# Patient Record
Sex: Male | Born: 1960 | Race: White | Hispanic: No | Marital: Married | Smoking: Current every day smoker
Health system: Southern US, Community
[De-identification: ages and names within clinical notes are randomized; demographics above are authoritative.]

## PROBLEM LIST (undated history)

## (undated) DIAGNOSIS — I2510 Atherosclerotic heart disease of native coronary artery without angina pectoris: Secondary | ICD-10-CM

## (undated) DIAGNOSIS — K219 Gastro-esophageal reflux disease without esophagitis: Secondary | ICD-10-CM

## (undated) DIAGNOSIS — I714 Abdominal aortic aneurysm, without rupture, unspecified: Secondary | ICD-10-CM

## (undated) DIAGNOSIS — R9430 Abnormal result of cardiovascular function study, unspecified: Secondary | ICD-10-CM

## (undated) DIAGNOSIS — Q631 Lobulated, fused and horseshoe kidney: Secondary | ICD-10-CM

## (undated) DIAGNOSIS — I10 Essential (primary) hypertension: Secondary | ICD-10-CM

## (undated) DIAGNOSIS — Z720 Tobacco use: Secondary | ICD-10-CM

## (undated) DIAGNOSIS — IMO0002 Reserved for concepts with insufficient information to code with codable children: Secondary | ICD-10-CM

## (undated) HISTORY — DX: Atherosclerotic heart disease of native coronary artery without angina pectoris: I25.10

## (undated) HISTORY — DX: Lobulated, fused and horseshoe kidney: Q63.1

## (undated) HISTORY — DX: Reserved for concepts with insufficient information to code with codable children: IMO0002

## (undated) HISTORY — DX: Abnormal result of cardiovascular function study, unspecified: R94.30

## (undated) HISTORY — DX: Abdominal aortic aneurysm, without rupture: I71.4

## (undated) HISTORY — DX: Gastro-esophageal reflux disease without esophagitis: K21.9

## (undated) HISTORY — DX: Tobacco use: Z72.0

## (undated) HISTORY — DX: Abdominal aortic aneurysm, without rupture, unspecified: I71.40

---

## 1998-09-18 HISTORY — PX: CORONARY STENT PLACEMENT: SHX1402

## 1999-12-09 ENCOUNTER — Encounter: Payer: Self-pay | Admitting: Emergency Medicine

## 1999-12-09 ENCOUNTER — Inpatient Hospital Stay (HOSPITAL_COMMUNITY): Admission: EM | Admit: 1999-12-09 | Discharge: 1999-12-13 | Payer: Self-pay | Admitting: Emergency Medicine

## 2000-06-26 ENCOUNTER — Emergency Department (HOSPITAL_COMMUNITY): Admission: EM | Admit: 2000-06-26 | Discharge: 2000-06-26 | Payer: Self-pay | Admitting: Emergency Medicine

## 2002-05-07 ENCOUNTER — Emergency Department (HOSPITAL_COMMUNITY): Admission: EM | Admit: 2002-05-07 | Discharge: 2002-05-07 | Payer: Self-pay | Admitting: Emergency Medicine

## 2006-01-23 ENCOUNTER — Emergency Department (HOSPITAL_COMMUNITY): Admission: EM | Admit: 2006-01-23 | Discharge: 2006-01-23 | Payer: Self-pay | Admitting: Emergency Medicine

## 2009-05-21 ENCOUNTER — Emergency Department (HOSPITAL_COMMUNITY): Admission: EM | Admit: 2009-05-21 | Discharge: 2009-05-21 | Payer: Self-pay | Admitting: Emergency Medicine

## 2009-09-20 ENCOUNTER — Emergency Department (HOSPITAL_COMMUNITY): Admission: EM | Admit: 2009-09-20 | Discharge: 2009-09-20 | Payer: Self-pay | Admitting: Emergency Medicine

## 2010-12-04 LAB — URINALYSIS, ROUTINE W REFLEX MICROSCOPIC
Bilirubin Urine: NEGATIVE
Glucose, UA: NEGATIVE mg/dL
Ketones, ur: NEGATIVE mg/dL
Leukocytes, UA: NEGATIVE
Nitrite: NEGATIVE
Protein, ur: NEGATIVE mg/dL
Specific Gravity, Urine: 1.018 (ref 1.005–1.030)
Urobilinogen, UA: 1 mg/dL (ref 0.0–1.0)
pH: 6 (ref 5.0–8.0)

## 2010-12-04 LAB — DIFFERENTIAL
Basophils Absolute: 0.3 10*3/uL — ABNORMAL HIGH (ref 0.0–0.1)
Basophils Relative: 2 % — ABNORMAL HIGH (ref 0–1)
Eosinophils Absolute: 0.5 10*3/uL (ref 0.0–0.7)
Eosinophils Relative: 4 % (ref 0–5)
Lymphocytes Relative: 22 % (ref 12–46)
Monocytes Absolute: 1 10*3/uL (ref 0.1–1.0)

## 2010-12-04 LAB — CBC
HCT: 49.9 % (ref 39.0–52.0)
Hemoglobin: 17.7 g/dL — ABNORMAL HIGH (ref 13.0–17.0)
MCHC: 35.5 g/dL (ref 30.0–36.0)
MCV: 86.2 fL (ref 78.0–100.0)
Platelets: 424 10*3/uL — ABNORMAL HIGH (ref 150–400)
RBC: 5.78 MIL/uL (ref 4.22–5.81)
RDW: 13.5 % (ref 11.5–15.5)
WBC: 12.3 10*3/uL — ABNORMAL HIGH (ref 4.0–10.5)

## 2010-12-04 LAB — COMPREHENSIVE METABOLIC PANEL
AST: 15 U/L (ref 0–37)
Albumin: 3.4 g/dL — ABNORMAL LOW (ref 3.5–5.2)
BUN: 9 mg/dL (ref 6–23)
Calcium: 8.6 mg/dL (ref 8.4–10.5)
Creatinine, Ser: 1.16 mg/dL (ref 0.4–1.5)
GFR calc Af Amer: >60 mL/min (ref >60)
Sodium: 138 mEq/L (ref 135–145)
Total Protein: 6.8 g/dL (ref 6.0–8.3)

## 2010-12-04 LAB — URINE MICROSCOPIC-ADD ON

## 2010-12-23 LAB — BASIC METABOLIC PANEL
CO2: 25 mEq/L (ref 19–32)
Calcium: 8.5 mg/dL (ref 8.4–10.5)
GFR calc Af Amer: >60 mL/min (ref >60)
Glucose, Bld: 122 mg/dL — ABNORMAL HIGH (ref 70–99)
Potassium: 3.3 mEq/L — ABNORMAL LOW (ref 3.5–5.1)
Sodium: 141 mEq/L (ref 135–145)

## 2010-12-23 LAB — CBC
HCT: 42.8 % (ref 39.0–52.0)
Hemoglobin: 14.8 g/dL (ref 13.0–17.0)
MCHC: 34.6 g/dL (ref 30.0–36.0)
RBC: 4.83 MIL/uL (ref 4.22–5.81)
RDW: 13.6 % (ref 11.5–15.5)

## 2010-12-23 LAB — DIFFERENTIAL
Basophils Relative: 0 % (ref 0–1)
Eosinophils Absolute: 0.6 10*3/uL (ref 0.0–0.7)
Lymphs Abs: 2.5 10*3/uL (ref 0.7–4.0)
Monocytes Absolute: 0.8 10*3/uL (ref 0.1–1.0)
Monocytes Relative: 7 % (ref 3–12)
Neutro Abs: 7.8 10*3/uL — ABNORMAL HIGH (ref 1.7–7.7)
Neutrophils Relative %: 66 % (ref 43–77)

## 2011-02-03 NOTE — Cardiovascular Report (Signed)
[place]. [facility]  Patient:    [name], Andrew Huber                      MRN: [id number] Proc. Date: 12/12/99 Adm. Date:  [id number] Disc. Date: [id number] Attending:  Mirian Mo CC:         Everardo Beals. Juanda Chance, [name] [facility]             Dr. Ladona Ridgel; [place], [place]             Daisey Must, [name] [facility]             [name], [name] [facility]                        Cardiac Catheterization  INDICATIONS:  Mr. Trippe is 50 years old; recently admitted with unstable angina, after being seen in [facility] Emergency Room.  The diagnostic catheterization was performed by Dr. Antoine Poche, which showed a high-grade lesion in the proximal LAD, as well as a high-grade lesion in the circumflex marginal vessel. The circumflex was dominant.  There was a posterior descending lesion, but this was fairly far distal.  The right coronary artery was a small, nondominant vessel that had moderately diffuse disease.  We elected to perform intervention on the LAD nd the obtuse marginal.  The ECG showed anterior T-wave changes, and we felt the LAD was the culprit.  PROCEDURAL NOTE:  The procedure was performed by the right femoral artery with  7-French sheath and 7-French 4.0 Voda guiding catheter.  We used a long floppy ire for the LAD and crossed the lesion without difficulty.  We direct-stented with  13 mm 3.5 Tetra stent, and deployed this with two inflations of 12 and 14 atm for 49 and 38 sec.  We then post-dilated with a 3.75 x 9 mm MC Ranger, with one inflation of 15 atm for 44 sec.  Repeat diagnostic study was then performed through the guiding catheter.  We next turned our attention to the circumflex marginal vessel.  We used a Sport wire and crossed the lesion without difficulty.  We went in with a 3.0 x 15 mm cutting balloon, and positioned this across the ostium of the marginal vessel.  There was an ulcerated plaque in the native circumflex artery,  right at the superior edge of the ostium.  We performed a total of three inflations, up to 7 atm for 52 sec.  Repeat diagnostic study was then performed through the guiding catheter.  The patient tolerated the procedure well and left the laboratory in satisfactory condition.  RESULTS:  Initially the stenosis in the proximal LAD was 90%.  Following stenting this improved to 0%.  The lesion in the circumflex marginal was initially 90%.  Following balloon cutting this improved to 40%.  The ulcerated plaque that was located just superior to the ostium improved also after the cutting balloon.  CONCLUSIONS: 1. Successful stenting of the proximal LAD, with improvement of percent luminal    narrowing from 90% to 0%. 2. Successful balloon angioplasty with a cutting balloon of the ostium of the    circumflex marginal vessel.  There was improvement of percent luminal narrowing    from 90% to 40%.  DISPOSITION:  The patient was returned to the post-anesthesia unit for further observation. DD:  12/12/99 TD:  12/13/99 Job: [id number]

## 2011-02-03 NOTE — Discharge Summary (Signed)
[place]. [facility]  Patient:    Andrew Huber, Andrew Huber                      MRN: [id number] Adm. Date:  [id number] Disc. Date: [id number] Attending:  Mirian Mo Dictator:   Tereso Newcomer, P.A. CC:         Belva Crome, [name] in [facility], [name] [facility], [facility]                           Discharge Summary  DATE OF BIRTH:  1961-03-13  DISCHARGE DIAGNOSES: 1. Coronary artery disease, status post stent to LAD, status post PTCA to OM    December 12, 1999. 2. Hyperlipidemia. 3. Hypertension. 4. Tobacco abuse. 5. History of heavy alcohol use. Quit 10 years ago. 6. History of cocaine and marijuana use. Quit 10 years ago.  HISTORY OF PRESENT ILLNESS:  This 50 year old male with no prior cardiac history started having bilateral chest pain and epigastric pain that would awake him at 4:30 or 5 oclock in the morning about two weeks prior to admission. The pain started to get more frequent, especially during the day. He eventually went to [facility] to have a workup. His enzymes were negative per his report. The patient had a persantine Cardiolite performed by Dr. Tomie China on December 07, 1999. Cardial images were consistent with a small area of anterior wall ischemia. EF 55%. There was approximately 1 mm ST segment depression in lead V3 compared to baseline EKG during the study. Baseline EKG had nonspecific ST-T changes. No significant symptoms or arrhythmias developed. The patient was, therefore, set up for an outpatient cardiac catheterization with Daisey Must, [name] at [facility] on Tuesday, December 13, 1999. However, he became much worse on the date of admission. He took two nitroglycerin that morning and two nitroglycerin that evening. The nitroglycerin did provide some relief. He had positive radiation to his neck and arms bilaterally. Positive diaphoresis. Positive nausea and shortness of breath. No  exertional symptoms, except for dyspnea on exertion with minimal activity. He stated that he would get short of breath going to the bathroom. Upon initial examination in the emergency room, he was pain free.  His cardiac risk factors include positive tobacco history, two packs per day for 25 years. Positive history of cocaine and marijuana use, discontinued 10 years ago. Positive history of heavy alcohol use, quit 10 years ago. Positive family history of CAD. Positive hypertension, recently started on medications.   ADMISSION MEDICATIONS: 1. Lopressor 25 mg b.i.d. 2. Captopril 50 mg t.i.d. 3. Aspirin q.d. 4. Nitroglycerin p.r.n.  ADMISSION PHYSICAL EXAMINATION:  GENERAL:  Revealed a well-nourished, well-developed male in no acute distress.  VITAL SIGNS:  Blood pressure 188/118, pulse in the 70s, respirations at 15.  HEART:  S1/S2. No obvious murmurs. No S4.  LUNGS:  Decreased breath sounds. Clear to auscultation bilaterally.  VASCULAR:  Reveals 2+ femoral artery pulses. No bruits noted.  NEUROLOGICAL:  Nonfocal.  LABORATORY DATA:  EKG:  Heart rate 75. Normal sinus rhythm. About 1 mm ST segment depression in 1, aVL, V4 through V6. T wave inversions in V4 through V6.  Chest x-ray:  No acute disease noted.  TSH 1.262. Total cholesterol 194, triglycerides 130, HDL 29, LDL 139. Sodium 140, potassium 3.5, chloride 109, CO2 26, glucose 94, BUN 15, creatinine  1, calcium 9.3, total protein 7, albumin 3.6, AST 21, ALT 24, alkaline phosphatase 62, total bilirubin 0.2. WBC 10.8, hemoglobin 15.2, hematocrit 42.7, MCV 83.5, RDW 12.9, platelet count 368. PT 13.5, INR 1.1, PTT 30. Cardiac enzymes:  Total CK #1 66, CK-MB 0.5, troponin I of less than 0.03. Total CK #2 56, CK-MB 0.7, troponin I of less than 0.03. Total CK #3 43, CK-MB of less than 0.3, troponin I of less than 0.03. Total CK #4 49, CK-MB 0.3, troponin I of less than 0.03. Last troponin 0.07.  HOSPITAL COURSE:  The patient  was admitted to telemetry and placed on IV nitroglycerin and heparin, aspirin, beta blocker, and ACE inhibitor. He was setup for cardiac catheterization on Monday after admission. He did have some other episodes of chest tightness prior to his catheterization that were relieved with nitroglycerin. The patient had worsening EKG changes with these symptoms as well. He was placed on Aggrastat in addition to his other medications. He remained stable after being placed on the Aggrastat. He had no recurrent symptoms. On December 12, 1999, he went for cardiac catheterization. Left main was normal. LAD osteal 25% proximal, 90%-60% mid long 30% diffuse disease plaquing throughout, circumflex diffuse AV groove plaquing, MOM osteal 80%, PL mid 30% small vessel RCA nondominant mid 80%. EF 60%. The patient had PCI performed that same day. Stent was placed to the LAD, reducing stenosis from 90% to 0%. PTCA was performed to the OM reducing stenosis from 90 to 40%. He tolerated the procedure well. He was noted to be stable on December 13, 1999. His groin showed no signs of hematoma or bruit. Therefore, he was discharged to home.  DISCHARGE MEDICATIONS: 1. Plavix 75 mg q.d. for 4 weeks. 2. Lopressor 50 mg b.i.d. 3. Captopril 50 mg t.i.d. 4. Lipitor 20 mg q.h.s. 5. Enteric-coated aspirin 325 mg q.d. 6. Nitroglycerin 0.4 mg sublingual p.r.n. chest pain.  Please note the patients discharge EKG did show anterolateral T wave inversions.  ACTIVITIES:  The patient is to do no driving, sexual activity, heavy lifting, or exertional activity for three days.  DIET:  He is to maintain a low-fat, low-cholesterol, low-sodium diet.  INSTRUCTIONS:  He should watch his groin for increased swelling, bleeding, or redness and call our office with any concerns. The patient has been advised to quit smoking.  FOLLOW-UP:  He has been setup for a follow-up appointment with Dr. Tomie China on Monday, December 19, 1999, at 10:30  a.m. DD:  12/13/99 TD:  12/13/99 Job: [id number]

## 2011-02-03 NOTE — Cardiovascular Report (Signed)
[place]. [facility]  Patient:    Andrew Huber, Andrew Huber                      MRN: [id number] Proc. Date: 12/12/99 Adm. Date:  [id number] Disc. Date: [id number] Attending:  Mirian Mo CC:         Cardiac Catheterization Laboratory             [name], [name] [facility]                        Cardiac Catheterization  DATE OF BIRTH:  [birth year]  PROCEDURE:  Left heart catheterization/coronary arteriography.  CARDIOLOGIST:  [name], [name]  INDICATIONS:  Evaluate patient with unstable angina and electrocardiogram changes.  DESCRIPTION OF PROCEDURE:  A left heart catheterization was performed via the right femoral artery.  The vessels were cannulated using an anterior wall puncture.  6-French arterial sheath was inserted via the modified Seldinger technique.  A preformed Judkins and a pigtail catheter were utilized.  The patient tolerated he procedure well and left the laboratory in stable condition.  HEMODYNAMICS: LV:  170/24. AO:  172/101.  CORONARIES: 1. Left main coronary artery:  The left main coronary artery was normal. 2. Left anterior descending coronary artery:  Had an ostial 25% stenosis,    followed by a proximal 90% stenosis, and mid-60% stenosis.  There was a long    mid-30% plaquing.  There was diffuse plaquing throughout the left system. 3. Circumflex coronary artery:  The circumflex was a dominant vessel.  There    was again diffuse plaquing in the AV groove.  A mid-obtuse marginal was a    large vessel with an ostial 80% stenosis.  A distal posterolateral was a    small vessel with mid-80% stenosis. 4. Right coronary artery:  the right coronary artery was nondominant with    mid-80% stenosis.  LEFT VENTRICULOGRAM:  The left ventriculogram was obtained in the RAO projection. The ejection fraction was 60% with normal wall motion.  CONCLUSION:  Three-vessel coronary artery disease equivalent.  PLAN:  The patient will  have a percutaneous revascularization of the left anterior descending coronary artery and mid-obtuse marginal artery per Dr. Everardo Beals. [name]. He will also have aggressive secondary risk factor modification with the need to stop smoking.  He will continue on lipid-lowering agents.DD:  12/12/99 TD:  12/13/99 Job: [id number]

## 2011-03-31 ENCOUNTER — Emergency Department (HOSPITAL_COMMUNITY)
Admission: EM | Admit: 2011-03-31 | Discharge: 2011-04-01 | Disposition: A | Discharge status: Home / Self Care | Payer: Medicaid Other | Attending: Emergency Medicine | Admitting: Emergency Medicine

## 2011-03-31 DIAGNOSIS — I10 Essential (primary) hypertension: Secondary | ICD-10-CM | POA: Insufficient documentation

## 2011-03-31 DIAGNOSIS — R12 Heartburn: Secondary | ICD-10-CM | POA: Insufficient documentation

## 2011-03-31 DIAGNOSIS — Z79899 Other long term (current) drug therapy: Secondary | ICD-10-CM | POA: Insufficient documentation

## 2011-03-31 DIAGNOSIS — R109 Unspecified abdominal pain: Secondary | ICD-10-CM | POA: Insufficient documentation

## 2011-03-31 DIAGNOSIS — K921 Melena: Secondary | ICD-10-CM | POA: Insufficient documentation

## 2011-03-31 DIAGNOSIS — I714 Abdominal aortic aneurysm, without rupture, unspecified: Secondary | ICD-10-CM | POA: Insufficient documentation

## 2011-03-31 DIAGNOSIS — I2510 Atherosclerotic heart disease of native coronary artery without angina pectoris: Secondary | ICD-10-CM | POA: Insufficient documentation

## 2011-03-31 DIAGNOSIS — R110 Nausea: Secondary | ICD-10-CM | POA: Insufficient documentation

## 2011-03-31 DIAGNOSIS — K922 Gastrointestinal hemorrhage, unspecified: Secondary | ICD-10-CM | POA: Insufficient documentation

## 2011-03-31 HISTORY — DX: Essential (primary) hypertension: I10

## 2011-04-01 ENCOUNTER — Encounter (HOSPITAL_COMMUNITY): Payer: Self-pay | Admitting: Radiology

## 2011-04-01 ENCOUNTER — Emergency Department (HOSPITAL_COMMUNITY): Payer: Medicaid Other

## 2011-04-01 LAB — DIFFERENTIAL
Eosinophils Relative: 6 % — ABNORMAL HIGH (ref 0–5)
Lymphocytes Relative: 32 % (ref 12–46)
Monocytes Absolute: 0.8 10*3/uL (ref 0.1–1.0)
Monocytes Relative: 7 % (ref 3–12)
Neutro Abs: 5.7 10*3/uL (ref 1.7–7.7)

## 2011-04-01 LAB — CBC
HCT: 39.2 % (ref 39.0–52.0)
Hemoglobin: 13.7 g/dL (ref 13.0–17.0)
MCH: 29.6 pg (ref 26.0–34.0)
MCHC: 34.9 g/dL (ref 30.0–36.0)
RDW: 13.4 % (ref 11.5–15.5)

## 2011-04-01 LAB — BASIC METABOLIC PANEL
BUN: 46 mg/dL — ABNORMAL HIGH (ref 6–23)
CO2: 21 mEq/L (ref 19–32)
Calcium: 8.7 mg/dL (ref 8.4–10.5)
Creatinine, Ser: 1.21 mg/dL (ref 0.50–1.35)
Glucose, Bld: 105 mg/dL — ABNORMAL HIGH (ref 70–99)

## 2011-04-01 LAB — URINALYSIS, ROUTINE W REFLEX MICROSCOPIC
Glucose, UA: NEGATIVE mg/dL
Hgb urine dipstick: NEGATIVE
Ketones, ur: NEGATIVE mg/dL
Protein, ur: NEGATIVE mg/dL
Urobilinogen, UA: 0.2 mg/dL (ref 0.0–1.0)

## 2011-04-01 MED ORDER — IOHEXOL 300 MG/ML  SOLN
100.0000 mL | Freq: Once | INTRAMUSCULAR | Status: AC | PRN
  Administered 2011-04-01: 100 mL via INTRAVENOUS

## 2011-07-17 ENCOUNTER — Encounter: Payer: Self-pay | Admitting: Cardiology

## 2011-07-19 ENCOUNTER — Encounter: Payer: Self-pay | Admitting: *Deleted

## 2011-07-20 ENCOUNTER — Institutional Professional Consult (permissible substitution): Payer: Self-pay | Admitting: Cardiology

## 2011-07-21 ENCOUNTER — Encounter: Payer: Self-pay | Admitting: Cardiology

## 2011-07-21 DIAGNOSIS — K219 Gastro-esophageal reflux disease without esophagitis: Secondary | ICD-10-CM | POA: Insufficient documentation

## 2011-07-21 DIAGNOSIS — I10 Essential (primary) hypertension: Secondary | ICD-10-CM | POA: Insufficient documentation

## 2011-07-24 ENCOUNTER — Encounter: Payer: Self-pay | Admitting: Cardiology

## 2011-07-24 ENCOUNTER — Ambulatory Visit (INDEPENDENT_AMBULATORY_CARE_PROVIDER_SITE_OTHER): Payer: Medicaid Other | Admitting: Cardiology

## 2011-07-24 DIAGNOSIS — K219 Gastro-esophageal reflux disease without esophagitis: Secondary | ICD-10-CM

## 2011-07-24 DIAGNOSIS — Z720 Tobacco use: Secondary | ICD-10-CM | POA: Insufficient documentation

## 2011-07-24 DIAGNOSIS — Q638 Other specified congenital malformations of kidney: Secondary | ICD-10-CM

## 2011-07-24 DIAGNOSIS — Q631 Lobulated, fused and horseshoe kidney: Secondary | ICD-10-CM

## 2011-07-24 DIAGNOSIS — I10 Essential (primary) hypertension: Secondary | ICD-10-CM

## 2011-07-24 DIAGNOSIS — F17200 Nicotine dependence, unspecified, uncomplicated: Secondary | ICD-10-CM

## 2011-07-24 DIAGNOSIS — I714 Abdominal aortic aneurysm, without rupture: Secondary | ICD-10-CM | POA: Insufficient documentation

## 2011-07-24 DIAGNOSIS — I2510 Atherosclerotic heart disease of native coronary artery without angina pectoris: Secondary | ICD-10-CM

## 2011-07-24 LAB — BASIC METABOLIC PANEL
BUN: 19 mg/dL (ref 6–23)
CO2: 26 mEq/L (ref 19–32)
Calcium: 8.6 mg/dL (ref 8.4–10.5)
Glucose, Bld: 86 mg/dL (ref 70–99)
Sodium: 141 mEq/L (ref 135–145)

## 2011-07-24 MED ORDER — AMLODIPINE BESYLATE 5 MG PO TABS: 5.0000 mg | ORAL_TABLET | Freq: Every day | ORAL | Status: DC

## 2011-07-24 NOTE — Assessment & Plan Note (Addendum)
The patient has known coronary disease with a stent placed in 2001.  He is having some exertional symptoms.  We need to assess him further.  His EKG is markedly abnormal.  Will arrange for a Myoview scan to be done pharmacologically. Also his EKG is markedly abnormal.  We need to reassess his LV function.  2-D echo will be done.

## 2011-07-24 NOTE — Assessment & Plan Note (Signed)
The patient's blood pressure is significantly elevated today.  He does appear to be somewhat improved since his outpatient visit with his primary physician.  I will add additional medication including amlodipine and see him back for early followup.

## 2011-07-24 NOTE — Patient Instructions (Signed)
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 For further information please visit [email or web address] Please follow instruction sheet, as given.   Your physician has requested that you have an echocardiogram. Echocardiography is a painless test that uses sound waves to create images of your heart. It provides your doctor with information about the size and shape of your heart and how well your heart's chambers and valves are working. This procedure takes approximately one hour. There are no restrictions for this procedure.

## 2011-07-24 NOTE — Assessment & Plan Note (Signed)
Patient continues to smoke. I counseled him to stop. 

## 2011-07-24 NOTE — Assessment & Plan Note (Signed)
We know that his aneurysm is very small by CT scan done July, 2012.  He will need a followup abdominal ultrasound in the future but not at this time.

## 2011-07-24 NOTE — Assessment & Plan Note (Signed)
Patient has significant GERD.  His medication should be continued.

## 2011-07-24 NOTE — Assessment & Plan Note (Signed)
This type of anatomy does not necessarily mean any renal dysfunction.  However with his current hypertension we need to check his chemistry.

## 2011-07-24 NOTE — Progress Notes (Signed)
HPI Patient is here today for evaluation of chest discomfort.  He is also here to follow up a prior diagnosis of coronary artery disease.  In addition he has a small abdominal aortic aneurysm that needs to be assessed and monitored over time.  He also has severe hypertension.  He is here to reestablish overall cardiology care.  We saw him last in 2001.  As part of today's evaluation I carefully reviewed the old records that I can find.  In addition I reviewed the information sent by his primary physician.  The patient has had some GI symptoms.  He is being treated for GERD.  Along with this so he's had some discomfort in his chest and his arms.  He has known coronary disease with a stent placed in 2001.  At that time he did have arm discomfort. No Known Allergies  Current Outpatient Prescriptions  Medication Sig Dispense Refill  . atorvastatin (LIPITOR) 20 MG tablet Take 20 mg by mouth daily.        Marland Kitchen lisinopril (PRINIVIL,ZESTRIL) 20 MG tablet Take 20 mg by mouth daily.        . metoprolol (TOPROL-XL) 100 MG 24 hr tablet Take 100 mg by mouth daily.        . nitroGLYCERIN (NITROSTAT) 0.4 MG SL tablet Place 0.4 mg under the tongue every 5 (five) minutes as needed.          History   Social History  . Marital Status: Married    Spouse Name: N/A    Number of Children: N/A  . Years of Education: N/A   Occupational History  . Not on file.   Social History Main Topics  . Smoking status: Current Everyday Smoker  . Smokeless tobacco: Not on file  . Alcohol Use: Yes     occasional  . Drug Use: No  . Sexually Active: Not on file   Other Topics Concern  . Not on file   Social History Narrative  . No narrative on file    Family History  Problem Relation Age of Onset  . Hypertension    . Hyperlipidemia    . Heart disease      Past Medical History  Diagnosis Date  . Hypertension   . CAD (coronary artery disease)     Stent to LAD,  PTCA OM  2001  . GERD (gastroesophageal reflux  disease)   . AAA (abdominal aortic aneurysm)     3 cm saccular aneurysm, CT of the abdomen, July, 2012  . Ejection fraction     EF 55%, cath 2001  . Tobacco abuse   . Horseshoe kidney     Abdominal CT, July, 2012    Past Surgical History  Procedure Date  . Coronary stent placement 2000    ROS  Patient denies fever, chills, headache, sweats, rash, change in vision, change in hearing, cough, urinary symptoms.  All other systems are reviewed and are negative other than the history of present illness.  PHYSICAL EXAM Patient is stable.  He is oriented to person time and place.  Affect is normal.  Head is atraumatic.  There is no xanthelasma.  There is no jugular venous distention.  Lungs are clear.  Respiratory effort is nonlabored.  Cardiac exam reveals muscle and S2.  No clicks or significant murmurs.  Abdomen is soft.  There is no peripheral edema.  No musculoskeletal deformities.  There are no skin rashes. Filed Vitals:   07/24/11 1050  BP: 156/110  Pulse: 90  Resp: 18  Height: 5\' 11"  (1.803 m)  Weight: 232 lb 12.8 oz (105.597 kg)    EKG is done today and reviewed by me.  I have compared to the tracing of July 17, 2011.  There is no significant change.  The patient has increased voltage and significant diffuse T-wave changes.  This may all be related to his hypertensive disease.  ASSESSMENT & PLAN

## 2011-08-07 ENCOUNTER — Ambulatory Visit (HOSPITAL_COMMUNITY): Payer: Medicaid Other | Attending: Cardiology | Admitting: Radiology

## 2011-08-07 ENCOUNTER — Ambulatory Visit (HOSPITAL_BASED_OUTPATIENT_CLINIC_OR_DEPARTMENT_OTHER): Payer: Medicaid Other | Admitting: Radiology

## 2011-08-07 VITALS — Ht 71.0 in | Wt 229.0 lb

## 2011-08-07 DIAGNOSIS — R0602 Shortness of breath: Secondary | ICD-10-CM | POA: Insufficient documentation

## 2011-08-07 DIAGNOSIS — I10 Essential (primary) hypertension: Secondary | ICD-10-CM | POA: Insufficient documentation

## 2011-08-07 DIAGNOSIS — Z8249 Family history of ischemic heart disease and other diseases of the circulatory system: Secondary | ICD-10-CM | POA: Insufficient documentation

## 2011-08-07 DIAGNOSIS — R072 Precordial pain: Secondary | ICD-10-CM

## 2011-08-07 DIAGNOSIS — R0989 Other specified symptoms and signs involving the circulatory and respiratory systems: Secondary | ICD-10-CM | POA: Insufficient documentation

## 2011-08-07 DIAGNOSIS — R079 Chest pain, unspecified: Secondary | ICD-10-CM | POA: Insufficient documentation

## 2011-08-07 DIAGNOSIS — Z9861 Coronary angioplasty status: Secondary | ICD-10-CM | POA: Insufficient documentation

## 2011-08-07 DIAGNOSIS — R5381 Other malaise: Secondary | ICD-10-CM | POA: Insufficient documentation

## 2011-08-07 DIAGNOSIS — R000 Tachycardia, unspecified: Secondary | ICD-10-CM | POA: Insufficient documentation

## 2011-08-07 DIAGNOSIS — I2510 Atherosclerotic heart disease of native coronary artery without angina pectoris: Secondary | ICD-10-CM

## 2011-08-07 DIAGNOSIS — F17200 Nicotine dependence, unspecified, uncomplicated: Secondary | ICD-10-CM | POA: Insufficient documentation

## 2011-08-07 DIAGNOSIS — R0609 Other forms of dyspnea: Secondary | ICD-10-CM | POA: Insufficient documentation

## 2011-08-07 DIAGNOSIS — R9431 Abnormal electrocardiogram [ECG] [EKG]: Secondary | ICD-10-CM | POA: Insufficient documentation

## 2011-08-07 MED ORDER — TECHNETIUM TC 99M TETROFOSMIN IV KIT
11.0000 | PACK | Freq: Once | INTRAVENOUS | Status: AC | PRN
  Administered 2011-08-07: 11 via INTRAVENOUS

## 2011-08-07 MED ORDER — TECHNETIUM TC 99M TETROFOSMIN IV KIT
33.0000 | PACK | Freq: Once | INTRAVENOUS | Status: AC | PRN
  Administered 2011-08-07: 33 via INTRAVENOUS

## 2011-08-07 MED ORDER — REGADENOSON 0.4 MG/5ML IV SOLN
0.4000 mg | Freq: Once | INTRAVENOUS | Status: AC
  Administered 2011-08-07: 0.4 mg via INTRAVENOUS

## 2011-08-07 NOTE — Progress Notes (Signed)
[facility] SITE 3 NUCLEAR MED [address] [place] [address] [phone]  Cardiology Nuclear Med Study  Andrew Huber is a 50 y.o. male [id number] [birth year]   Nuclear Med Background Indication for Stress Test:  Evaluation for Ischemia, Stent Patency, PTCA Patency and Abnormal [id number] T wave changes History: '01 Angioplasty, '01 Heart Catheterization: EF 55% to stent, '01 Myocardial Perfusion Study: EF 55% ant wall ischemia and '01 Stents: LAD Cardiac Risk Factors: Family History - CAD, Hypertension and Smoker  Symptoms:  Chest Pain, DOE, Fatigue, Rapid HR and SOB   Nuclear Pre-Procedure Caffeine/Decaff Intake:  10:00pm BC powder  NPO After: 12:00am   Lungs:  clear IV 0.9% NS with Angio Cath:  20g  IV Site: L Antecubital  IV Started by:  Frederick Peers, EMT-P  Chest Size (in):  44 Cup Size: n/a  Height: 5\' 11"  (1.803 m)  Weight:  229 lb (103.874 kg)  BMI:  Body mass index is 31.94 kg/(m^2). Tech Comments:  Toprol taken at 0630 am this morning. Patient states BP remains high with RX, advised continue to monitor and follow up with primary care. Any extremes or symptomatic to to the ED.    Nuclear Med Study 1 or 2 day study: 1 day  Stress Test Type:  Treadmill/Lexiscan  Reading MD: Willa Rough, MD  Order Authorizing Provider:  Effie Shy  Resting Radionuclide: Technetium 47m Tetrofosmin  Resting Radionuclide Dose: 11.0 mCi   Stress Radionuclide:  Technetium 55m Tetrofosmin  Stress Radionuclide Dose: 33.0 mCi           Stress Protocol Rest HR: 57 Stress HR: 93  Rest BP: 158/93 Stress BP: 169/110  Exercise Time (min): n/a METS: n/a   Predicted Max HR: 170 bpm % Max HR: 54.71 bpm Rate Pressure Product: 69629   Dose of Adenosine (mg):  n/a Dose of Lexiscan: 0.4 mg  Dose of Atropine (mg): n/a Dose of Dobutamine: n/a mcg/kg/min (at max HR)  Stress Test Technologist: Milana Na, EMT-P  Nuclear Technologist:  Domenic Polite, CNMT      Rest Procedure:  Myocardial perfusion imaging was performed at rest 45 minutes following the intravenous administration of Technetium 65m Tetrofosmin. Rest ECG: Sinus Bradycardia  Stress Procedure:  The patient received IV Lexiscan 0.4 mg over 15-seconds with concurrent low level exercise and then Technetium 15m Tetrofosmin was injected at 30-seconds while the patient continued walking one more minute.  There were no significant changes with Lexiscan.  Quantitative spect images were obtained after a 45-minute delay. Stress ECG: No significant change from baseline ECG  QPS Raw Data Images:  Patient motion noted; appropriate software correction applied. Stress Images:  Decreased activity in a small area at the base of the inferior wall. Also, decreased activity at the base of the inferolateral wall, Rest Images:  The defect in the inferior wall remains fixed. The defect in the inferolateral wall has some reversibilty, Subtraction (SDS):  Mild ischemia in the inferolateral wall. Transient Ischemic Dilatation (Normal <1.22):  1.05 Lung/Heart Ratio (Normal <0.45):  0.33  Quantitative Gated Spect Images QGS EDV:  198 ml QGS ESV:  136 ml QGS cine images:  There is diffuse dyssynergy. I am not convinced that the motion data is reliable. Echo comparison will be needed. QGS EF: 31%  Impression Exercise Capacity:  Lexiscan with no exercise. BP Response:  Normal blood pressure response. Clinical Symptoms:  SOB ECG Impression:  No significant ST segment change suggestive of ischemia. Comparison with Prior Nuclear Study:  No images to compare  Overall Impression:  There is a small scar at the base of the inferior wall. There is small scar/ischemia at the base of the inferolateral wall. The wall motion data may not be reliable.  Willa Rough, MD

## 2011-08-14 ENCOUNTER — Encounter: Payer: Self-pay | Admitting: Cardiology

## 2011-08-14 ENCOUNTER — Telehealth: Payer: Self-pay | Admitting: Cardiology

## 2011-08-14 DIAGNOSIS — R9430 Abnormal result of cardiovascular function study, unspecified: Secondary | ICD-10-CM | POA: Insufficient documentation

## 2011-08-14 DIAGNOSIS — I2510 Atherosclerotic heart disease of native coronary artery without angina pectoris: Secondary | ICD-10-CM | POA: Insufficient documentation

## 2011-08-14 DIAGNOSIS — IMO0002 Reserved for concepts with insufficient information to code with codable children: Secondary | ICD-10-CM | POA: Insufficient documentation

## 2011-08-14 NOTE — Telephone Encounter (Signed)
Fu call Pt was returning your call about test results

## 2011-08-15 ENCOUNTER — Encounter: Payer: Self-pay | Admitting: Cardiology

## 2011-08-15 ENCOUNTER — Ambulatory Visit (INDEPENDENT_AMBULATORY_CARE_PROVIDER_SITE_OTHER): Payer: Medicaid Other | Admitting: Cardiology

## 2011-08-15 DIAGNOSIS — I2510 Atherosclerotic heart disease of native coronary artery without angina pectoris: Secondary | ICD-10-CM

## 2011-08-15 DIAGNOSIS — I10 Essential (primary) hypertension: Secondary | ICD-10-CM

## 2011-08-15 MED ORDER — AMLODIPINE BESYLATE 10 MG PO TABS: 10.0000 mg | ORAL_TABLET | Freq: Every day | ORAL | Status: DC

## 2011-08-15 MED ORDER — HYDROCHLOROTHIAZIDE 25 MG PO TABS: 25.0000 mg | ORAL_TABLET | Freq: Every day | ORAL | Status: DC

## 2011-08-15 NOTE — Telephone Encounter (Signed)
N/A.  LMTC. 

## 2011-08-15 NOTE — Patient Instructions (Signed)
Your physician recommends that you schedule a follow-up appointment in: 3 MONTHS WITH DR Myrtis Ser AND  NEEDS TO SEE PMD  IN 2-3 WEEKS   Your physician has recommended you make the following change in your medication: INCREASE AMLODIPINE TO 10 MG EVERY DAY AND START HCTZ 25 MG EVERY DAY

## 2011-08-15 NOTE — Assessment & Plan Note (Signed)
Blood pressure remains elevated.  His amlodipine will be increased to 10 mg daily and hydrochlorothiazide will be added.  I've asked him to follow up with his primary physician for blood pressure followup and further adjustment.  I will see him back in 3 months.

## 2011-08-15 NOTE — Assessment & Plan Note (Signed)
The patient's coronary status is stable.  He has some old scar at the base of the inferior wall.  There may be some mild peri-infarct ischemia.  There is no high-grade ischemia.  He does not need catheterization at this time.

## 2011-08-15 NOTE — Progress Notes (Signed)
HPI   Patient is seen today for follow up chest pain and hypertension.  I saw him last July 24, 2011.  Renal he has coronary disease.  He had a stent placed to the LAD in the past and a PTCA of OM.  This was done in 2001.  A nuclear stress study was done along with an echo.  The nuclear study raised the question of small scar and a small amount of ischemia at the base of the inferior and inferolateral walls.  The wall motion assessment of the nuclear scan was not reliable.  The two-dimensional echo revealed decreased motion at the base of the inferolateral wall.  Patient returned today and I have reviewed these issues with him.  Also his blood pressure was quite high and I started amlodipine at the time of his last visit.  His blood pressure remains elevated but it is somewhat improved. No Known Allergies  Current Outpatient Prescriptions  Medication Sig Dispense Refill  . amLODipine (NORVASC) 5 MG tablet Take 1 tablet (5 mg total) by mouth daily.  30 tablet  11  . atorvastatin (LIPITOR) 20 MG tablet Take 20 mg by mouth daily.        Marland Kitchen lisinopril (PRINIVIL,ZESTRIL) 20 MG tablet Take 20 mg by mouth daily.        . metoprolol (TOPROL-XL) 100 MG 24 hr tablet Take 100 mg by mouth daily.        . nitroGLYCERIN (NITROSTAT) 0.4 MG SL tablet Place 0.4 mg under the tongue every 5 (five) minutes as needed.        Marland Kitchen omeprazole (PRILOSEC) 20 MG capsule Take 20 mg by mouth daily.          History   Social History  . Marital Status: Married    Spouse Name: N/A    Number of Children: N/A  . Years of Education: N/A   Occupational History  . Not on file.   Social History Main Topics  . Smoking status: Current Everyday Smoker  . Smokeless tobacco: Not on file  . Alcohol Use: Yes     occasional  . Drug Use: No  . Sexually Active: Not on file   Other Topics Concern  . Not on file   Social History Narrative  . No narrative on file    Family History  Problem Relation Age of Onset  .  Hypertension    . Hyperlipidemia    . Heart disease      Past Medical History  Diagnosis Date  . Hypertension   . CAD (coronary artery disease)     Stent to LAD,  PTCA OM  2001 /   Nuclear, 08/07/2011, small scar at the base of the inferior wall., small scar/ischemia at the base of the inferolateral wall., wall motion data appears to be not reliable, Referred to. echo to  . GERD (gastroesophageal reflux disease)   . AAA (abdominal aortic aneurysm)     3 cm saccular aneurysm, CT of the abdomen, July, 2012  . Ejection fraction     EF 55%, cath 2001 /  EF 50-55%, echo, August 07, 2011, basal  inferior akinesis, Basal inferolateral hypokinesis.  . Tobacco abuse   . Horseshoe kidney     Abdominal CT, July, 2012    Past Surgical History  Procedure Date  . Coronary stent placement 2000    ROS    Patient denies fever, chills, headache, sweats, rash, change in vision, change in hearing, chest pain, cough, nausea  vomiting, urinary symptoms.  All other systems are reviewed and are negative.  PHYSICAL EXAM     Patient is stable today.  There is no jugular venous distention.  Lungs are clear.  Respiratory effort is nonlabored.  Cardiac exam reveals S1-S2.  No clicks or significant murmurs.  Abdomen is soft.  There is no peripheral edema.  Filed Vitals:   08/15/11 0955  BP: 165/103  Pulse: 66  Height: 5\' 11"  (1.803 m)  Weight: 234 lb 12.8 oz (106.505 kg)     ASSESSMENT & PLAN

## 2011-08-16 NOTE — Telephone Encounter (Signed)
Pt states he already received his test results at his appt yesterday

## 2011-10-30 ENCOUNTER — Ambulatory Visit: Payer: Medicaid Other | Admitting: Cardiology

## 2011-10-31 ENCOUNTER — Ambulatory Visit: Payer: Medicaid Other | Admitting: Physician Assistant

## 2012-01-29 ENCOUNTER — Ambulatory Visit: Payer: Medicaid Other | Admitting: Physician Assistant

## 2012-01-31 ENCOUNTER — Encounter: Payer: Self-pay | Admitting: Physician Assistant

## 2012-01-31 ENCOUNTER — Ambulatory Visit (INDEPENDENT_AMBULATORY_CARE_PROVIDER_SITE_OTHER): Payer: Medicaid Other | Admitting: Physician Assistant

## 2012-01-31 VITALS — BP 148/94 | HR 63 | Ht 71.0 in | Wt 222.0 lb

## 2012-01-31 DIAGNOSIS — I714 Abdominal aortic aneurysm, without rupture: Secondary | ICD-10-CM

## 2012-01-31 DIAGNOSIS — F17200 Nicotine dependence, unspecified, uncomplicated: Secondary | ICD-10-CM

## 2012-01-31 DIAGNOSIS — I10 Essential (primary) hypertension: Secondary | ICD-10-CM

## 2012-01-31 DIAGNOSIS — I2510 Atherosclerotic heart disease of native coronary artery without angina pectoris: Secondary | ICD-10-CM

## 2012-01-31 DIAGNOSIS — R0602 Shortness of breath: Secondary | ICD-10-CM

## 2012-01-31 DIAGNOSIS — Z720 Tobacco use: Secondary | ICD-10-CM

## 2012-01-31 MED ORDER — ALBUTEROL SULFATE HFA 108 (90 BASE) MCG/ACT IN AERS: 2.0000 | INHALATION_SPRAY | Freq: Four times a day (QID) | RESPIRATORY_TRACT | Status: DC | PRN

## 2012-01-31 MED ORDER — LISINOPRIL 40 MG PO TABS: 40.0000 mg | ORAL_TABLET | Freq: Every day | ORAL | Status: DC

## 2012-01-31 NOTE — Progress Notes (Signed)
[address] [place], [place]  [address] Phone: [phone] Fax:  [phone]  Date:  01/31/2012   Name:  Andrew Huber   DOB:  [birth year]   MRN:  [id number]  PCP:  Berlinda Last, DO  Primary Cardiologist:  Dr. Zackery Barefoot  Primary Electrophysiologist:  None    History of Present Illness: Andrew Huber is a 51 y.o. male who returns for follow up.    He has a history of CAD, s/p prior PCI of the LAD and OM in 2001, hypertension, 3 cm saccular AAA abdominal CT 03/2011, tobacco abuse and GERD.  He saw Dr. Myrtis Ser 07/2011 for chest discomfort.  Prior to that visit, he was last seen in 2001.  LHC 11/1999: oLAD 25%, pLAD 90%, mLAD 60% and a long 30%, oMOM 80%, mPL 80% (small), mRCA 80% (non-dominant), EF 60%.  PCI 3/01 with Dr. Juanda Chance: BMS to the pLAD and cutting balloon angioplasty to the oOM.  Medical therapy pursued for the PL which was small and the RCA which was nondominant.  Lexiscan Myoview 07/2011: Small scar at the base of the inferior wall, small scar/ischemia at the base of the inferolateral wall, wall motion not reliable, EF calculated at 31%.  Echocardiogram 07/2011: Basal inferior AK, basal inferolateral HK, mild LVH, EF 50-55%, mild LAE.  Last seen by Dr. Myrtis Ser 08/15/11.  He felt there was no high-grade ischemia on his stress test and no further evaluation was planned at that time.  Patient's blood pressure was elevated and his amlodipine was adjusted with plans for follow up in 3 months.  He has been unable to return due to losing his Medicaid.  Just got it back.  Of note, out of medications until this past weekend.  Was feeling poorly.  But, now since back on HTN meds, feels better.  Denies chest pain, syncope, orthopnea, PND, edema.  Notes dyspnea with more extreme activities.  Notes class 2b symptoms at times.  Of note, smoking since age 41.  Currently at 1 1/2 ppd.  Has a chronic cough and notes wheezing.  Of note, asked 3 times about disability.  I  explained to him that from a cardiac standpoint, he does not qualify.    Wt Readings from Last 3 Encounters:  01/31/12 222 lb (100.699 kg)  08/15/11 234 lb 12.8 oz (106.505 kg)  08/07/11 229 lb (103.874 kg)     Potassium  Date/Time Value Range Status  07/24/2011 12:07 PM 4.2  3.5-5.1 (mEq/L) Final     Creatinine, Ser  Date/Time Value Range Status  07/24/2011 12:07 PM 1.3  0.4-1.5 (mg/dL) Final     ALT  Date/Time Value Range Status  09/20/2009  9:43 PM 15  0-53 (U/L) Final     Hemoglobin  Date/Time Value Range Status  04/01/2011  2:03 AM 13.7  13.0-17.0 (g/dL) Final    Past Medical History  Diagnosis Date  . Hypertension   . CAD (coronary artery disease)     Stent to LAD,  PTCA OM  2001 /   Nuclear, 08/07/2011, small scar at the base of the inferior wall., small scar/ischemia at the base of the inferolateral wall., wall motion data appears to be not reliable, Referred to. echo to  . GERD (gastroesophageal reflux disease)   . AAA (abdominal aortic aneurysm)     3 cm saccular aneurysm, CT of the abdomen, July, 2012  . Ejection fraction     EF 55%, cath 2001 /  EF 50-55%, echo,  August 07, 2011, basal  inferior akinesis, Basal inferolateral hypokinesis.  . Tobacco abuse   . Horseshoe kidney     Abdominal CT, July, 2012    Current Outpatient Prescriptions  Medication Sig Dispense Refill  . amLODipine (NORVASC) 10 MG tablet Take 1 tablet (10 mg total) by mouth daily.  30 tablet  11  . atorvastatin (LIPITOR) 20 MG tablet Take 20 mg by mouth daily.        . hydrochlorothiazide (HYDRODIURIL) 25 MG tablet Take 1 tablet (25 mg total) by mouth daily.  30 tablet  11  . lisinopril (PRINIVIL,ZESTRIL) 20 MG tablet Take 20 mg by mouth daily.        . metoprolol (TOPROL-XL) 100 MG 24 hr tablet Take 100 mg by mouth daily.        . nitroGLYCERIN (NITROSTAT) 0.4 MG SL tablet Place 0.4 mg under the tongue every 5 (five) minutes as needed.        Marland Kitchen omeprazole (PRILOSEC) 20 MG capsule Take 20  mg by mouth daily.          Allergies: No Known Allergies  History  Substance Use Topics  . Smoking status: Current Everyday Smoker  . Smokeless tobacco: Not on file  . Alcohol Use: Yes     occasional     ROS:  Please see the history of present illness.   Had URI symptoms recently.  Also noted abdominal pain and back pain until he got back on meds.  He was worried about his AAA.  Also, had n/v that has resolved.  He reports stomach pain when he is off Prilosec.  All other systems reviewed and negative.   PHYSICAL EXAM: VS:  BP 148/94  Pulse 63  Ht 5\' 11"  (1.803 m)  Wt 222 lb (100.699 kg)  BMI 30.96 kg/m2 Well nourished, well developed, in no acute distress HEENT: normal Neck: no JVD Vascular: no carotid bruits  Cardiac:  Distant  S1, S2; RRR; no murmur Lungs:  Decreased breath sounds bilaterally, no wheezing, rhonchi or rales Abd: soft, nontender, no hepatomegaly, no pulsatile masses Ext: no edema Skin: warm and dry Neuro:  CNs 2-12 intact, no focal abnormalities noted  EKG:  NSR, HR 69, normal axis, TW inversions in 1, 2, aVL, V4-6, no change from prior tracing   ASSESSMENT AND PLAN:  1.  Hypertension   -  Uncontrolled.   -  Just restarted medications.   -  Increase Lisinopril to 40 mg daily.   -  Check BMET with BP check in one week.  2.  Coronary Artery Disease   -  Stable.  No angina.  Continue ASA.   -  Recent myoview low risk.   -  Follow up with Dr. Zackery Barefoot in 3 mos.  3.  3 cm Abdominal Aortic Aneurysm   -  Arrange abdominal u/s.  4.  Dyspnea   -  Suspect he has COPD.   -  D/c cigs.   -  Arrange PFTs.   -  Rx for Proventil prn.   -  Refer to PCP.  If PFTs significantly abnormal, refer to pulmonary.   5.  Hyperlipidemia   -  Arrange FLP and LFTs.  6.  Tobacco Abuse   -  We discussed the importance of cessation and different strategies for quitting.   Counseled for > 10 mins.  7.  GERD   -  Continue PPI.  Refer to PCP.   Signed, Tereso Newcomer, PA-C  10:53 AM 01/31/2012

## 2012-01-31 NOTE — Patient Instructions (Addendum)
Your physician recommends that you schedule a follow-up appointment in: 3 MONTHS WITH DR. KATZ  PLEASE SCHEDULE NURSE VISIT FOR BLOOD PRESSURE CHECK WITH LAB WORK THE SAME DAY FOR A FASTING LIPID/LIVER PANEL AND BMET  Your physician has recommended that you have a pulmonary function test WITH DLCO; DX 786.05 SOB TO BE DONE @ [facility]. Pulmonary Function Tests are a group of tests that measure how well air moves in and out of your lungs.   PLEASE REFER PT TO [facility] DX HTN  Your physician has requested that you have an abdominal aorta duplex DX AAA. During this test, an ultrasound is used to evaluate the aorta. Allow 30 minutes for this exam. Do not eat after midnight the day before and avoid carbonated beverages  STOP SMOKING  INCREASE LISINOPRIL TO 40 MG DAILY  START PROVENTIL HFA INHALER TAKE 1-2 PUFFS EVERY 4-6 HOURS AS NEEDED

## 2012-02-07 ENCOUNTER — Other Ambulatory Visit: Payer: Medicaid Other

## 2012-02-07 ENCOUNTER — Ambulatory Visit: Payer: Medicaid Other

## 2012-02-08 ENCOUNTER — Other Ambulatory Visit: Payer: Medicaid Other

## 2012-02-08 ENCOUNTER — Ambulatory Visit: Payer: Medicaid Other

## 2012-03-01 ENCOUNTER — Ambulatory Visit: Payer: Self-pay

## 2012-03-01 ENCOUNTER — Other Ambulatory Visit: Payer: Self-pay

## 2012-05-10 ENCOUNTER — Ambulatory Visit: Payer: Medicaid Other | Admitting: Cardiology

## 2012-06-18 ENCOUNTER — Encounter: Payer: Self-pay | Admitting: Cardiology

## 2012-06-19 ENCOUNTER — Other Ambulatory Visit: Payer: Self-pay | Admitting: Cardiology

## 2012-07-07 ENCOUNTER — Other Ambulatory Visit: Payer: Self-pay | Admitting: Cardiology

## 2012-08-21 ENCOUNTER — Other Ambulatory Visit: Payer: Self-pay

## 2012-08-21 DIAGNOSIS — I10 Essential (primary) hypertension: Secondary | ICD-10-CM

## 2012-08-21 DIAGNOSIS — I2510 Atherosclerotic heart disease of native coronary artery without angina pectoris: Secondary | ICD-10-CM

## 2012-08-21 MED ORDER — HYDROCHLOROTHIAZIDE 25 MG PO TABS: 25.0000 mg | ORAL_TABLET | Freq: Every day | ORAL | Status: DC

## 2012-08-21 MED ORDER — AMLODIPINE BESYLATE 10 MG PO TABS: 10.0000 mg | ORAL_TABLET | Freq: Every day | ORAL | Status: DC

## 2012-10-30 ENCOUNTER — Other Ambulatory Visit: Payer: Self-pay | Admitting: *Deleted

## 2012-10-30 MED ORDER — LISINOPRIL 40 MG PO TABS: 40.0000 mg | ORAL_TABLET | Freq: Every day | ORAL | Status: DC

## 2012-10-30 MED ORDER — ATORVASTATIN CALCIUM 20 MG PO TABS: 20.0000 mg | ORAL_TABLET | Freq: Every day | ORAL | Status: DC

## 2012-11-27 ENCOUNTER — Other Ambulatory Visit: Payer: Self-pay

## 2012-11-27 MED ORDER — METOPROLOL SUCCINATE ER 100 MG PO TB24: 100.0000 mg | ORAL_TABLET | Freq: Every day | ORAL | Status: DC

## 2013-01-08 ENCOUNTER — Other Ambulatory Visit: Payer: Self-pay

## 2013-01-08 DIAGNOSIS — I10 Essential (primary) hypertension: Secondary | ICD-10-CM

## 2013-01-08 DIAGNOSIS — I2510 Atherosclerotic heart disease of native coronary artery without angina pectoris: Secondary | ICD-10-CM

## 2013-01-08 MED ORDER — AMLODIPINE BESYLATE 10 MG PO TABS: 10.0000 mg | ORAL_TABLET | Freq: Every day | ORAL | Status: DC

## 2013-02-27 ENCOUNTER — Other Ambulatory Visit: Payer: Self-pay

## 2013-02-27 DIAGNOSIS — I10 Essential (primary) hypertension: Secondary | ICD-10-CM

## 2013-02-27 MED ORDER — HYDROCHLOROTHIAZIDE 25 MG PO TABS: 25.0000 mg | ORAL_TABLET | Freq: Every day | ORAL | Status: DC

## 2013-04-01 ENCOUNTER — Other Ambulatory Visit: Payer: Self-pay | Admitting: Cardiology

## 2013-04-01 NOTE — Telephone Encounter (Signed)
Would we fill this?

## 2013-04-21 ENCOUNTER — Encounter: Payer: Medicaid Other | Admitting: Physician Assistant

## 2013-04-21 ENCOUNTER — Telehealth: Payer: Self-pay | Admitting: Cardiology

## 2013-04-21 NOTE — Telephone Encounter (Signed)
New Prob      Pt is requesting a new prescription of his inhaler. Pt was scheduled to come in today, however, he had to reschedule due to having a sudden flat tire. Please call.

## 2013-04-21 NOTE — Telephone Encounter (Signed)
Follow up  ° ° ° ° °Pt is returning your call  °

## 2013-04-21 NOTE — Progress Notes (Signed)
This encounter was created in error - please disregard.

## 2013-04-21 NOTE — Telephone Encounter (Signed)
**Note De-identified Emiah Pellicano Obfuscation** LMTCB

## 2013-04-21 NOTE — Telephone Encounter (Signed)
Pts wife is advised that because the pt has canceled or no showed all but 1 of the past 14 f/u's and each time we refilled his meds anticipating him keeping his f/u appt's that he will need to contact his PCP for refill on his inhaler. Pt has f/u scheduled for 8/26 with Dr Myrtis Ser.

## 2013-05-13 ENCOUNTER — Ambulatory Visit: Payer: Medicaid Other | Admitting: Cardiology

## 2013-05-28 ENCOUNTER — Ambulatory Visit (INDEPENDENT_AMBULATORY_CARE_PROVIDER_SITE_OTHER): Payer: Self-pay | Admitting: Cardiology

## 2013-05-28 ENCOUNTER — Other Ambulatory Visit: Payer: Self-pay

## 2013-05-28 ENCOUNTER — Encounter: Payer: Self-pay | Admitting: Cardiology

## 2013-05-28 VITALS — BP 162/110 | HR 73 | Ht 71.0 in | Wt 206.0 lb

## 2013-05-28 DIAGNOSIS — I10 Essential (primary) hypertension: Secondary | ICD-10-CM

## 2013-05-28 DIAGNOSIS — Z720 Tobacco use: Secondary | ICD-10-CM

## 2013-05-28 DIAGNOSIS — I2510 Atherosclerotic heart disease of native coronary artery without angina pectoris: Secondary | ICD-10-CM

## 2013-05-28 DIAGNOSIS — I714 Abdominal aortic aneurysm, without rupture, unspecified: Secondary | ICD-10-CM

## 2013-05-28 DIAGNOSIS — F17200 Nicotine dependence, unspecified, uncomplicated: Secondary | ICD-10-CM

## 2013-05-28 MED ORDER — METOPROLOL SUCCINATE ER 100 MG PO TB24: 100.0000 mg | ORAL_TABLET | Freq: Every day | ORAL | Status: DC

## 2013-05-28 MED ORDER — AMLODIPINE BESYLATE 10 MG PO TABS: 10.0000 mg | ORAL_TABLET | Freq: Every day | ORAL | Status: DC

## 2013-05-28 MED ORDER — LISINOPRIL 40 MG PO TABS: 40.0000 mg | ORAL_TABLET | Freq: Every day | ORAL | Status: DC

## 2013-05-28 MED ORDER — HYDROCHLOROTHIAZIDE 25 MG PO TABS: 25.0000 mg | ORAL_TABLET | Freq: Every day | ORAL | Status: AC

## 2013-05-28 MED ORDER — NITROGLYCERIN 0.4 MG SL SUBL: 0.4000 mg | SUBLINGUAL_TABLET | SUBLINGUAL | Status: DC | PRN

## 2013-05-28 MED ORDER — ATORVASTATIN CALCIUM 20 MG PO TABS: 20.0000 mg | ORAL_TABLET | Freq: Every day | ORAL | Status: DC

## 2013-05-28 MED ORDER — ALBUTEROL SULFATE HFA 108 (90 BASE) MCG/ACT IN AERS: 2.0000 | INHALATION_SPRAY | Freq: Four times a day (QID) | RESPIRATORY_TRACT | Status: DC | PRN

## 2013-05-28 MED ORDER — OMEPRAZOLE 20 MG PO CPDR: 20.0000 mg | DELAYED_RELEASE_CAPSULE | Freq: Every day | ORAL | Status: AC

## 2013-05-28 NOTE — Assessment & Plan Note (Signed)
Have counseled the patient to stop smoking.  

## 2013-05-28 NOTE — Assessment & Plan Note (Signed)
We know from 2012 to the patient has an abdominal aortic aneurysm. I'm very concerned about this, with his noncompliance to followup in the problems with his blood pressure medicines. We will try very hard to get him back on his blood pressure medicines appropriately and see him for followup. I will arrange for an abdominal aortic duplex scan.

## 2013-05-28 NOTE — Assessment & Plan Note (Addendum)
I'm very concerned about his blood pressure today. He will resume his medicines.  As part of today's evaluation I spent greater than 25 minutes with the patient's total care. More than half of this time is been spent with direct contact with him. I had a long discussion with him about the importance of his medications and treating his blood pressure. I also had a long discussion with him about the importance of following up his abdominal aortic aneurysm. I made it clear to him that we need to be very careful and complete with these issues.

## 2013-05-28 NOTE — Patient Instructions (Signed)
**Note De-Identified Armandina Iman Obfuscation** Your physician has requested that you have an abdominal aorta duplex. During this test, an ultrasound is used to evaluate the aorta. Allow 30 minutes for this exam. Do not eat after midnight the day before and avoid carbonated beverages.  Your physician recommends that you schedule a follow-up appointment in: 6 weeks

## 2013-05-28 NOTE — Progress Notes (Signed)
HPI  Patient is seen today to followup hypertension and abdominal aortic aneurysm. I saw him last May, 2013. Unfortunately he did not followup and have a followup study of his abdominal aortic aneurysm. In addition he has not followed up with all and he is running out of his medications. He is markedly hypertensive today.  No Known Allergies  Current Outpatient Prescriptions  Medication Sig Dispense Refill  . albuterol (PROVENTIL HFA;VENTOLIN HFA) 108 (90 BASE) MCG/ACT inhaler Inhale 2 puffs into the lungs every 6 (six) hours as needed for wheezing or shortness of breath.  1 Inhaler  0  . amLODipine (NORVASC) 10 MG tablet Take 1 tablet (10 mg total) by mouth daily.  30 tablet  0  . aspirin 81 MG tablet Take 81 mg by mouth daily.      Marland Kitchen atorvastatin (LIPITOR) 20 MG tablet Take 1 tablet (20 mg total) by mouth daily.  30 tablet  6  . hydrochlorothiazide (HYDRODIURIL) 25 MG tablet Take 1 tablet (25 mg total) by mouth daily.  30 tablet  0  . lisinopril (PRINIVIL,ZESTRIL) 40 MG tablet Take 1 tablet (40 mg total) by mouth daily.  30 tablet  6  . metoprolol succinate (TOPROL-XL) 100 MG 24 hr tablet Take 1 tablet (100 mg total) by mouth daily.  30 tablet  1  . nitroGLYCERIN (NITROSTAT) 0.4 MG SL tablet Place 0.4 mg under the tongue every 5 (five) minutes as needed.        Marland Kitchen omeprazole (PRILOSEC) 20 MG capsule Take 20 mg by mouth daily.         No current facility-administered medications for this visit.    History   Social History  . Marital Status: Married    Spouse Name: N/A    Number of Children: N/A  . Years of Education: N/A   Occupational History  . Not on file.   Social History Main Topics  . Smoking status: Current Every Day Smoker -- 1.00 packs/day for 37 years    Types: Cigarettes  . Smokeless tobacco: Never Used  . Alcohol Use: Yes     Comment: occasional  . Drug Use: No  . Sexual Activity: Not on file   Other Topics Concern  . Not on file   Social History Narrative    . No narrative on file    Family History  Problem Relation Age of Onset  . Hypertension    . Hyperlipidemia    . Heart disease      Past Medical History  Diagnosis Date  . Hypertension   . CAD (coronary artery disease)     Stent to LAD,  PTCA OM  2001 /   Nuclear, 08/07/2011, small scar at the base of the inferior wall., small scar/ischemia at the base of the inferolateral wall., wall motion data appears to be not reliable, Referred to. echo to  . GERD (gastroesophageal reflux disease)   . AAA (abdominal aortic aneurysm)     3 cm saccular aneurysm, CT of the abdomen, July, 2012  . Ejection fraction     EF 55%, cath 2001 /  EF 50-55%, echo, August 07, 2011, basal  inferior akinesis, Basal inferolateral hypokinesis.  . Tobacco abuse   . Horseshoe kidney     Abdominal CT, July, 2012    Past Surgical History  Procedure Laterality Date  . Coronary stent placement  2000    Patient Active Problem List   Diagnosis Date Noted  . CAD (coronary artery disease)   .  Ejection fraction   . Tobacco abuse   . Horseshoe kidney   . AAA (abdominal aortic aneurysm)   . Hypertension   . GERD (gastroesophageal reflux disease)     ROS   Patient denies fever, chills, headache, sweats, rash, change in vision, change in hearing, chest pain, cough, nausea vomiting, urinary symptoms. He doesn't feel well in general today with his high blood pressure. All other systems are reviewed and are negative.  PHYSICAL EXAM  Patient is oriented to person time and place. Affect is normal. There is no jugulovenous distention. Lungs are clear. Respiratory effort is nonlabored. Cardiac exam reveals S1 and S2. There no clicks or significant murmurs. The abdomen is soft. There is no peripheral edema. There no musculoskeletal deformities. There are no skin rashes.  Filed Vitals:   05/28/13 1428  BP: 162/110  Pulse: 73  Height: 5\' 11"  (1.803 m)  Weight: 206 lb (93.441 kg)   EKG is done today and reviewed  by me. There are all diffuse nonspecific ST-T wave changes. There is no change from the prior tracing.  ASSESSMENT & PLAN

## 2013-05-28 NOTE — Assessment & Plan Note (Signed)
Patient's coronary status is stable. No testing is needed at this time.

## 2013-05-29 ENCOUNTER — Encounter: Payer: Self-pay | Admitting: Cardiology

## 2013-07-01 ENCOUNTER — Ambulatory Visit (HOSPITAL_COMMUNITY): Payer: Self-pay | Attending: Cardiology

## 2013-07-01 ENCOUNTER — Encounter (INDEPENDENT_AMBULATORY_CARE_PROVIDER_SITE_OTHER): Payer: Self-pay

## 2013-07-01 ENCOUNTER — Other Ambulatory Visit: Payer: Self-pay

## 2013-07-01 DIAGNOSIS — I714 Abdominal aortic aneurysm, without rupture, unspecified: Secondary | ICD-10-CM | POA: Insufficient documentation

## 2013-07-01 DIAGNOSIS — I10 Essential (primary) hypertension: Secondary | ICD-10-CM

## 2013-07-01 DIAGNOSIS — I700 Atherosclerosis of aorta: Secondary | ICD-10-CM | POA: Insufficient documentation

## 2013-07-01 DIAGNOSIS — I2510 Atherosclerotic heart disease of native coronary artery without angina pectoris: Secondary | ICD-10-CM | POA: Insufficient documentation

## 2013-07-01 DIAGNOSIS — J4489 Other specified chronic obstructive pulmonary disease: Secondary | ICD-10-CM | POA: Insufficient documentation

## 2013-07-01 DIAGNOSIS — F17200 Nicotine dependence, unspecified, uncomplicated: Secondary | ICD-10-CM | POA: Insufficient documentation

## 2013-07-01 DIAGNOSIS — J449 Chronic obstructive pulmonary disease, unspecified: Secondary | ICD-10-CM | POA: Insufficient documentation

## 2013-07-01 DIAGNOSIS — I708 Atherosclerosis of other arteries: Secondary | ICD-10-CM | POA: Insufficient documentation

## 2013-07-01 DIAGNOSIS — I723 Aneurysm of iliac artery: Secondary | ICD-10-CM | POA: Insufficient documentation

## 2013-07-01 DIAGNOSIS — E785 Hyperlipidemia, unspecified: Secondary | ICD-10-CM | POA: Insufficient documentation

## 2013-07-01 MED ORDER — ALBUTEROL SULFATE HFA 108 (90 BASE) MCG/ACT IN AERS: 2.0000 | INHALATION_SPRAY | Freq: Four times a day (QID) | RESPIRATORY_TRACT | Status: AC | PRN

## 2013-07-01 MED ORDER — AMLODIPINE BESYLATE 10 MG PO TABS: 10.0000 mg | ORAL_TABLET | Freq: Every day | ORAL | Status: AC

## 2013-07-01 MED ORDER — NITROGLYCERIN 0.4 MG SL SUBL: 0.4000 mg | SUBLINGUAL_TABLET | SUBLINGUAL | Status: AC | PRN

## 2013-07-01 MED ORDER — LISINOPRIL 40 MG PO TABS: 40.0000 mg | ORAL_TABLET | Freq: Every day | ORAL | Status: AC

## 2013-07-01 MED ORDER — LISINOPRIL 40 MG PO TABS: 40.0000 mg | ORAL_TABLET | Freq: Every day | ORAL | Status: DC

## 2013-07-01 MED ORDER — ATORVASTATIN CALCIUM 20 MG PO TABS: 20.0000 mg | ORAL_TABLET | Freq: Every day | ORAL | Status: AC

## 2013-07-01 MED ORDER — METOPROLOL SUCCINATE ER 100 MG PO TB24: 100.0000 mg | ORAL_TABLET | Freq: Every day | ORAL | Status: AC

## 2013-07-04 ENCOUNTER — Encounter: Payer: Self-pay | Admitting: Cardiology

## 2013-07-07 ENCOUNTER — Encounter: Payer: Self-pay | Admitting: Cardiology

## 2013-07-07 ENCOUNTER — Ambulatory Visit (INDEPENDENT_AMBULATORY_CARE_PROVIDER_SITE_OTHER): Payer: Self-pay | Admitting: Cardiology

## 2013-07-07 ENCOUNTER — Encounter (INDEPENDENT_AMBULATORY_CARE_PROVIDER_SITE_OTHER): Payer: Self-pay

## 2013-07-07 VITALS — BP 160/98 | HR 72 | Ht 71.0 in | Wt 203.0 lb

## 2013-07-07 DIAGNOSIS — I10 Essential (primary) hypertension: Secondary | ICD-10-CM

## 2013-07-07 DIAGNOSIS — I714 Abdominal aortic aneurysm, without rupture, unspecified: Secondary | ICD-10-CM

## 2013-07-07 NOTE — Progress Notes (Signed)
HPI  Patient returns today to follow up hypertension and abdominal aortic aneurysm. We have spent a great deal of time trying to help him get his medicines. There appears to be an ongoing issue with Medicaid that he is working with. We'll also make sure he has a copy of his medicines so that he can obtain them from any source that will fill them for him in his county.  We did complete his abdominal ultrasound. He has an ongoing abdominal aortic aneurysm. The size is relatively stable.  No Known Allergies  Current Outpatient Prescriptions  Medication Sig Dispense Refill  . albuterol (PROVENTIL HFA;VENTOLIN HFA) 108 (90 BASE) MCG/ACT inhaler Inhale 2 puffs into the lungs every 6 (six) hours as needed for wheezing or shortness of breath.  1 Inhaler  6  . amLODipine (NORVASC) 10 MG tablet Take 1 tablet (10 mg total) by mouth daily.  30 tablet  6  . aspirin 81 MG tablet Take 81 mg by mouth daily.      Marland Kitchen atorvastatin (LIPITOR) 20 MG tablet Take 1 tablet (20 mg total) by mouth daily.  30 tablet  6  . hydrochlorothiazide (HYDRODIURIL) 25 MG tablet Take 1 tablet (25 mg total) by mouth daily.  30 tablet  6  . lisinopril (PRINIVIL,ZESTRIL) 40 MG tablet Take 1 tablet (40 mg total) by mouth daily.  30 tablet  6  . metoprolol succinate (TOPROL-XL) 100 MG 24 hr tablet Take 1 tablet (100 mg total) by mouth daily.  30 tablet  6  . nitroGLYCERIN (NITROSTAT) 0.4 MG SL tablet Place 1 tablet (0.4 mg total) under the tongue every 5 (five) minutes as needed.  25 tablet  3  . omeprazole (PRILOSEC) 20 MG capsule Take 1 capsule (20 mg total) by mouth daily.  30 capsule  6   No current facility-administered medications for this visit.    History   Social History  . Marital Status: Married    Spouse Name: N/A    Number of Children: N/A  . Years of Education: N/A   Occupational History  . Not on file.   Social History Main Topics  . Smoking status: Current Every Day Smoker -- 1.00 packs/day for 37 years      Types: Cigarettes  . Smokeless tobacco: Never Used  . Alcohol Use: Yes     Comment: occasional  . Drug Use: No  . Sexual Activity: Not on file   Other Topics Concern  . Not on file   Social History Narrative  . No narrative on file    Family History  Problem Relation Age of Onset  . Hypertension    . Hyperlipidemia    . Heart disease      Past Medical History  Diagnosis Date  . Hypertension   . CAD (coronary artery disease)     Stent to LAD,  PTCA OM  2001 /   Nuclear, 08/07/2011, small scar at the base of the inferior wall., small scar/ischemia at the base of the inferolateral wall., wall motion data appears to be not reliable, Referred to. echo to  . GERD (gastroesophageal reflux disease)   . AAA (abdominal aortic aneurysm)     3 cm saccular aneurysm, CT of the abdomen, July, 2012  . Ejection fraction     EF 55%, cath 2001 /  EF 50-55%, echo, August 07, 2011, basal  inferior akinesis, Basal inferolateral hypokinesis.  . Tobacco abuse   . Horseshoe kidney     Abdominal  CT, July, 2012    Past Surgical History  Procedure Laterality Date  . Coronary stent placement  2000    Patient Active Problem List   Diagnosis Date Noted  . CAD (coronary artery disease)   . Ejection fraction   . Tobacco abuse   . Horseshoe kidney   . AAA (abdominal aortic aneurysm)   . Hypertension   . GERD (gastroesophageal reflux disease)     ROS   Patient denies fever, chills, headache, sweats, rash, change in vision, change in hearing, chest pain, cough, nausea vomiting, urinary symptoms. All other systems are reviewed and are negative.  PHYSICAL EXAM  Patient is stable. Lungs reveal scattered rhonchi. Cardiac exam reveals S1 and S2. The abdomen is soft. There is no edema.  Filed Vitals:   07/07/13 1504  BP: 160/98  Pulse: 72  Height: 5\' 11"  (1.803 m)  Weight: 203 lb (92.08 kg)     ASSESSMENT & PLAN

## 2013-07-07 NOTE — Patient Instructions (Signed)
**Note De-identified Rhett Najera Obfuscation** Your physician recommends that you continue on your current medications as directed. Please refer to the Current Medication list given to you today.  Your physician wants you to follow-up in: 1 year. You will receive a reminder letter in the mail two months in advance. If you don't receive a letter, please call our office to schedule the follow-up appointment.  

## 2013-07-07 NOTE — Assessment & Plan Note (Signed)
The patient's aneurysm measures 3.2 x 3.6. I had a careful discussion with him. I stressed the importance of blood pressure control to keep him from having a worsening of the dilatation of his aorta.

## 2013-07-07 NOTE — Assessment & Plan Note (Signed)
We are doing everything we can to help the patient received his medicines.

## 2014-10-24 ENCOUNTER — Emergency Department (HOSPITAL_COMMUNITY): Payer: No Typology Code available for payment source

## 2014-10-24 ENCOUNTER — Encounter (HOSPITAL_COMMUNITY): Payer: Self-pay

## 2014-10-24 ENCOUNTER — Emergency Department (HOSPITAL_COMMUNITY)
Admission: EM | Admit: 2014-10-24 | Discharge: 2014-10-24 | Disposition: A | Discharge status: Home / Self Care | Payer: No Typology Code available for payment source | Attending: Emergency Medicine | Admitting: Emergency Medicine

## 2014-10-24 DIAGNOSIS — K219 Gastro-esophageal reflux disease without esophagitis: Secondary | ICD-10-CM | POA: Insufficient documentation

## 2014-10-24 DIAGNOSIS — Z9861 Coronary angioplasty status: Secondary | ICD-10-CM | POA: Insufficient documentation

## 2014-10-24 DIAGNOSIS — I10 Essential (primary) hypertension: Secondary | ICD-10-CM | POA: Insufficient documentation

## 2014-10-24 DIAGNOSIS — S3992XA Unspecified injury of lower back, initial encounter: Secondary | ICD-10-CM | POA: Insufficient documentation

## 2014-10-24 DIAGNOSIS — Q631 Lobulated, fused and horseshoe kidney: Secondary | ICD-10-CM | POA: Insufficient documentation

## 2014-10-24 DIAGNOSIS — I2510 Atherosclerotic heart disease of native coronary artery without angina pectoris: Secondary | ICD-10-CM | POA: Insufficient documentation

## 2014-10-24 DIAGNOSIS — Y998 Other external cause status: Secondary | ICD-10-CM | POA: Insufficient documentation

## 2014-10-24 DIAGNOSIS — Y92410 Unspecified street and highway as the place of occurrence of the external cause: Secondary | ICD-10-CM | POA: Insufficient documentation

## 2014-10-24 DIAGNOSIS — Z79899 Other long term (current) drug therapy: Secondary | ICD-10-CM | POA: Insufficient documentation

## 2014-10-24 DIAGNOSIS — Z720 Tobacco use: Secondary | ICD-10-CM | POA: Insufficient documentation

## 2014-10-24 DIAGNOSIS — M545 Low back pain, unspecified: Secondary | ICD-10-CM

## 2014-10-24 DIAGNOSIS — Z7982 Long term (current) use of aspirin: Secondary | ICD-10-CM | POA: Insufficient documentation

## 2014-10-24 DIAGNOSIS — Y9389 Activity, other specified: Secondary | ICD-10-CM | POA: Insufficient documentation

## 2014-10-24 MED ORDER — HYDROCODONE-ACETAMINOPHEN 5-325 MG PO TABS: 1.0000 | ORAL_TABLET | ORAL | Status: AC | PRN

## 2014-10-24 MED ORDER — HYDROMORPHONE HCL 2 MG/ML IJ SOLN
2.0000 mg | Freq: Once | INTRAMUSCULAR | Status: AC
  Administered 2014-10-24: 2 mg via INTRAMUSCULAR
  Filled 2014-10-24: qty 1

## 2014-10-24 MED ORDER — NAPROXEN 500 MG PO TABS: 500.0000 mg | ORAL_TABLET | Freq: Two times a day (BID) | ORAL | Status: AC

## 2014-10-24 NOTE — ED Notes (Signed)
Pt states he has a ride home

## 2014-10-24 NOTE — ED Notes (Signed)
Has not taken BP meds

## 2014-10-24 NOTE — Discharge Instructions (Signed)
Please follow the directions provided. Be sure to follow-up with your primary care doctor or use the resource guide to establish care with a primary care doctor regarding your blood pressure. There are resources listed below to help you afforded your medicine so that you may take them as directed. Please follow-up with the orthopedic doctor if your back pain does not improve. You may take the naproxen twice a day to help with pain. You may take the Vicodin for pain not relieved by the naproxen. Don't hesitate to return for any new, worsening, or concerning symptoms.   SEEK IMMEDIATE MEDICAL CARE IF:  You have pain that radiates from your back into your legs.  You develop new bowel or bladder control problems.  You have unusual weakness or numbness in your arms or legs.  You develop nausea or vomiting.  You develop abdominal pain.  You feel faint.   Emergency Department Resource Guide 1) Find a Doctor and Pay Out of Pocket Although you won't have to find out who is covered by your insurance plan, it is a good idea to ask around and get recommendations. You will then need to call the office and see if the doctor you have chosen will accept you as a new patient and what types of options they offer for patients who are self-pay. Some doctors offer discounts or will set up payment plans for their patients who do not have insurance, but you will need to ask so you aren't surprised when you get to your appointment.  2) Contact Your Local Health Department Not all health departments have doctors that can see patients for sick visits, but many do, so it is worth a call to see if yours does. If you don't know where your local health department is, you can check in your phone book. The CDC also has a tool to help you locate your state's health department, and many state websites also have listings of all of their local health departments.  3) Find a Walk-in Clinic If your illness is not likely to be very  severe or complicated, you may want to try a walk in clinic. These are popping up all over the country in pharmacies, drugstores, and shopping centers. They're usually staffed by nurse practitioners or physician assistants that have been trained to treat common illnesses and complaints. They're usually fairly quick and inexpensive. However, if you have serious medical issues or chronic medical problems, these are probably not your best option.  No Primary Care Doctor: - Call Health Connect at  [phone] - they can help you locate a primary care doctor that  accepts your insurance, provides certain services, etc. - Physician Referral Service- [phone]  Chronic Pain Problems: Organization         Address  Phone   Notes  [facility]  [phone] Patients need to be referred by their primary care doctor.   Medication Assistance: Organization         Address  Phone   Notes  [facility] Medication [facility] [address] [place], [place] [address] [phone] --Must be a resident of [facility] -- Must have NO insurance coverage whatsoever (no Medicaid/ Medicare, etc.) -- The pt. MUST have a primary care doctor that directs their care regularly and follows them in the community   [facility]ist  [phone]   [employer]  [phone]    Agencies that provide inexpensive medical care: Organization  Address  Phone   Notes  [place]  [phone]   [facility]    [phone]   [facility] [place], [place] [address] [phone]   [place] [address] [place], [place] [phone]   Planned Parenthood    [phone]   [facility]    [phone]   [place] and [place] [address], [place] Phone:  [phone], Fax:  [phone] Hours of Operation:  9 am - 6 pm, M-F.  Also accepts  Medicaid/Medicare and self-pay.  [facility] for [place], [address], [place] Phone: [phone], Fax: [phone]. Hours of Operation:  8:30 am - 5:30 pm, M-F.  Also accepts Medicaid and self-pay.  [facility] [place] [address] [place] Phone: [phone]   [address], [place], [place] [phone], Ext. [phone] Mondays & Thursdays: 7-9 AM.  First 15 patients are seen on a first come, first serve basis.    [place] Providers:  Organization         Address  Phone   Notes  [facility] [address] [place] [address], [place] [phone] Also accepts self-pay patients.  [facility] [place] [address], [place]  [phone]   [place], [address], [place] [phone]   [facility] [address] [place] [phone]   Lucianne Lei [address], [place]   [phone] Only accepts [place] Access [place] patients after they have their name applied to their card.   Self-Pay (no insurance) in [facility]:  Organization         Address  Phone   Notes  Sickle Cell Patients, [facility] [place] [phone]   [facility] [place] [phone]   [facility] [place]  [place], [place],  [phone]   [facility]/Dr. Osei-Bonsu  [address] [place] or [address], [place] [phone] Phone number for both [place] and [place] locations is the same.  [facility] and [facility] [address], [place] [phone]   [facility] [address] [place] or [address] [phone]   [facility] [address], [place] [phone], phone; [phone], fax Sees patients 1st and 3rd Saturday of every month.  Must not qualify for public or private insurance (i.e. Medicaid, Medicare, [place] Health Choice, Veterans' Benefits)  Household income should be no more than 200% of the poverty level The clinic cannot treat you if you are pregnant or think you are pregnant  Sexually transmitted diseases are not treated at the clinic.    Dental Care: Organization         Address  Phone  Notes  [facility] Department [facility] [place] [phone] Accepts children up to age 83 who are enrolled in [place] or [place]; pregnant women with a Medicaid card; and children who have applied for Medicaid or [place] Health Choice, but were declined, whose parents can pay a reduced fee at time  of service.  [facility] Department of [employer] [facility]  [address], [place] [phone] Accepts children up to age 34 who are enrolled in [place] or [place]; pregnant women with a Medicaid card; and children who have applied for Medicaid or [place] Health Choice, but were declined, whose parents can pay a reduced fee at time of service.  [place] Adult Dental Access PROGRAM  [place] [phone] Patients are seen by appointment only. Walk-ins are not accepted. [place] will see patients 47 years of age and older. Monday - Tuesday (8am-5pm) Most Wednesdays (8:30-5pm) $30 per visit, cash only  [facility] Adult Dental Access PROGRAM  [address], [facility] [phone] Patients are seen by appointment only. Walk-ins are not accepted. [place] will see patients 77 years of age and older. One Wednesday Evening (Monthly: Volunteer Based).  $30 per visit, cash only  [place]  [phone] for adults; Children under age 30, call Graduate Pediatric Dentistry at [phone]. Children aged  3-14, please call [phone] to request a pediatric application.  Dental services are provided in all areas of dental care including fillings, crowns and bridges, complete and partial dentures, implants, gum treatment, root canals, and extractions. Preventive care is also provided. Treatment is provided to both adults and children. Patients are selected via a lottery and there is often a waiting list.   [facility] [address], [place]  [phone] [email or web address]   [facility] [address] [place], [place] [phone], Ext. [phone] Second and Fourth Thursday of each month, opens at 6:30 AM; Clinic ends at 9 AM.  Patients are seen on a first-come first-served basis, and a limited number are seen during each clinic.   [facility]  [address] Hillard D[place]er [place], [place] [phone]   Eligibility Requirements You must have lived in [place], [place], or [place] counties for at least the last three months.   You cannot be eligible for state or federal sponsored [employer], including [employer], [place], or [employer].   You generally cannot be eligible for healthcare insurance through your employer.    How to apply: Eligibility screenings are held every Tuesday and Wednesday afternoon from 1:00 pm until 4:00 pm. You do not need an appointment for the interview!  [facility] [address], [place], [place]   [place]  [place] Department  [place]  [phone]    Behavioral Health Resources in the Community: Intensive Outpatient Programs Organization         Address  Phone  Notes  [place]. [address], [place], [place] [phone]   [facility] Outpatient [address] [place], [place]   ADS: [facility] [address]  [place], [place]   [place] [address]  [place], [place] or [phone]   Substance Abuse Resources Organization         Address  Phone  Notes  [facility] Drug [facility]  [phone]   [place]  [phone]   [facility] [place]   Chinita Pester  [phone]   Residential & Outpatient Substance Abuse Program  [phone]   Psychological Services Organization         Address  Phone  Notes  [facility]  [phone]- [phone]   [facility]  [phone]- [phone]   [facility] [address], [place] [phone] or [phone]    Mobile Crisis Teams Organization         Address  Phone  Notes  [facility], Mobile Crisis Care Unit  [phone]   [facility]  [address] [place], [place] [phone]   Doristine LocksSharon DeEsch [address] [place] [phone]    Self-Help/Support Groups Organization         Address  Phone             Notes  [facility] [place] - variety of support groups  [phone]- [phone] Call for more information  Narcotics Anonymous (NA), [facility] [address], [place]  2 meetings at this location   Statisticianesidential Treatment Programs Organization         Address  Phone  Notes  [facility] [address],    [place]  [phone]   [facility]  [address], [place] [phone], [place], [place] [phone]   [facility] [address], [place] [phone] Admissions: 8am-3pm M-F  [facility] [address]    [place], [place] [phone]   [facility] [address], [place], [place] [phone]   [facility] [address]  [place], [place] [phone]   [facility] - Intensive Outpatient [address], [place], [place] [phone]   [facility]  ([facility]iction Recovery [facility] [address]  [place], [place] [phone] or [phone]   [facility] (RT[facility]) [address] [place], [place] [phone] Accepts Medicaid  [facility] [address]  [place] [phone] Substance Abuse/Addiction Treatment   [facility] Resources Organization         Address  Phone  Notes  [facility]  [phone](888) [phone]   Angie FavaJulie Brannon, PhD [address], [address] [place], [place]   [phone]) [phone] or [phone](336) [phone]   [facility]   [address] [place], [place] [phone]) [phone]   [facility] [address], [place], [place] [phone]) [phone] Insurance/Medicaid/sponsorship through [facility]  [facility] [address]                                    [place], [place] [phone]) [phone] Therapy/tele-psych/case  [facility] [address].   [place], [place] [phone]    Dr. Lolly MustacheArfeen  [phone]   [facility]  United [employer] [facility] 1) [address],  2) [address], [place] 3)  [address], [place] [phone]) [phone]  [phone]   [facility] Child Abuse Hotline [phone]) [phone] or [phone](336) [phone] (After Hours)
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
## 2016-01-05 IMAGING — CR DG LUMBAR SPINE COMPLETE 4+V
5 series · 5 of 5 positions shown · non-contrast
Comparison: CT scan 04/01/2011

CLINICAL DATA: Low back and bilateral leg pain

EXAM:
LUMBAR SPINE - COMPLETE 4+ VIEW

[t lumbar spine ap]
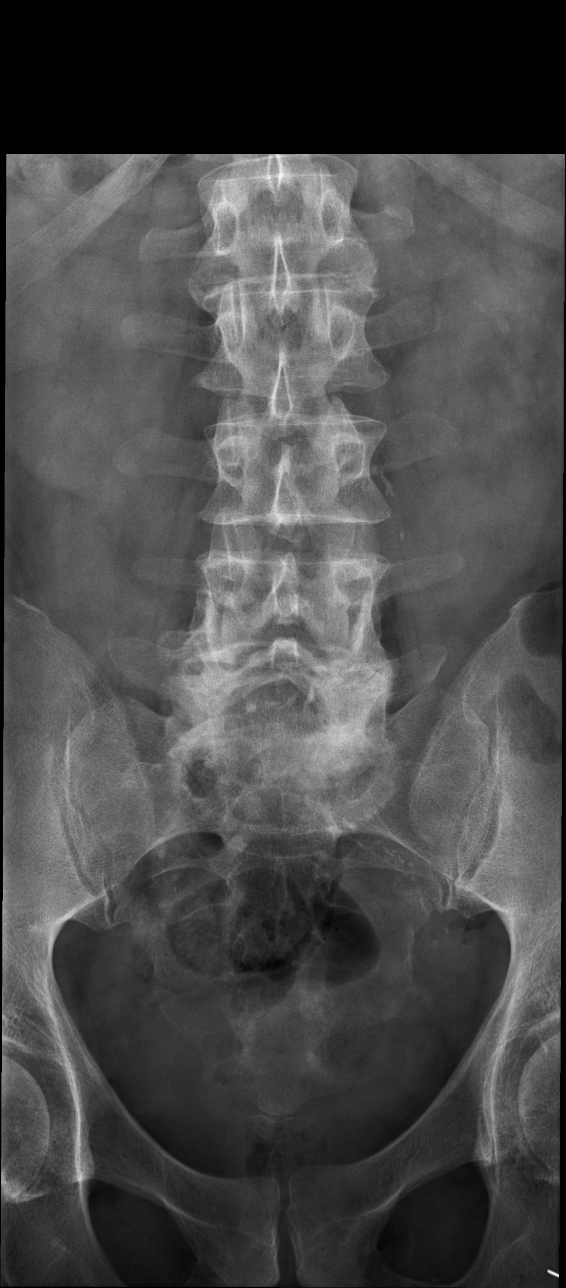

[t lumbar spine obl (1 of 2)]
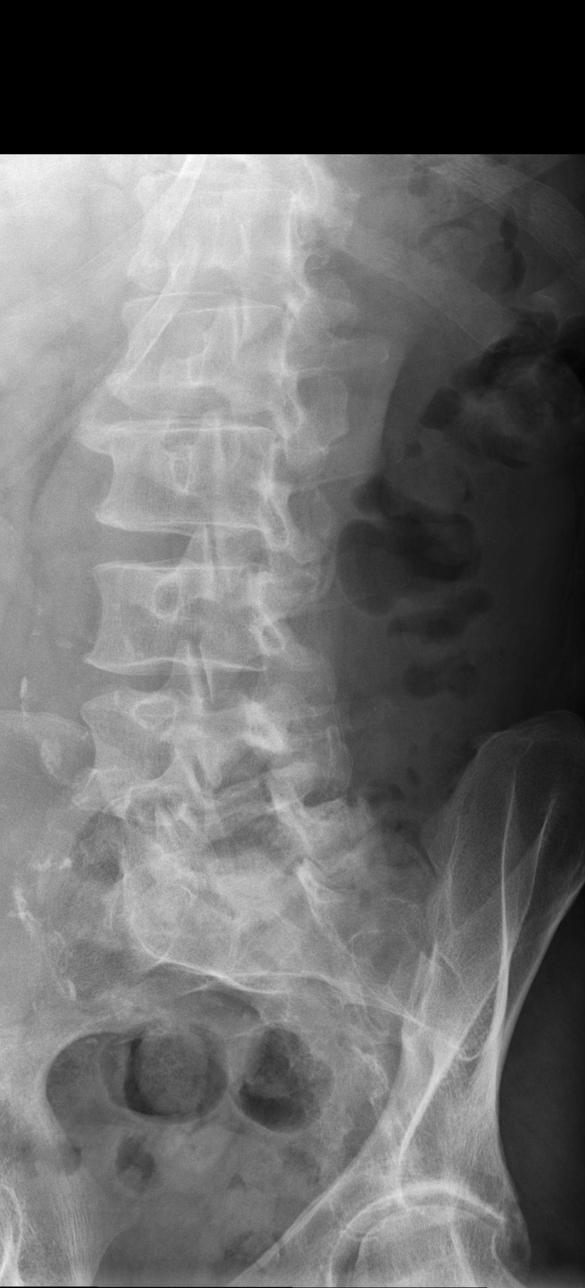

[t lumbar spine obl (2 of 2)]
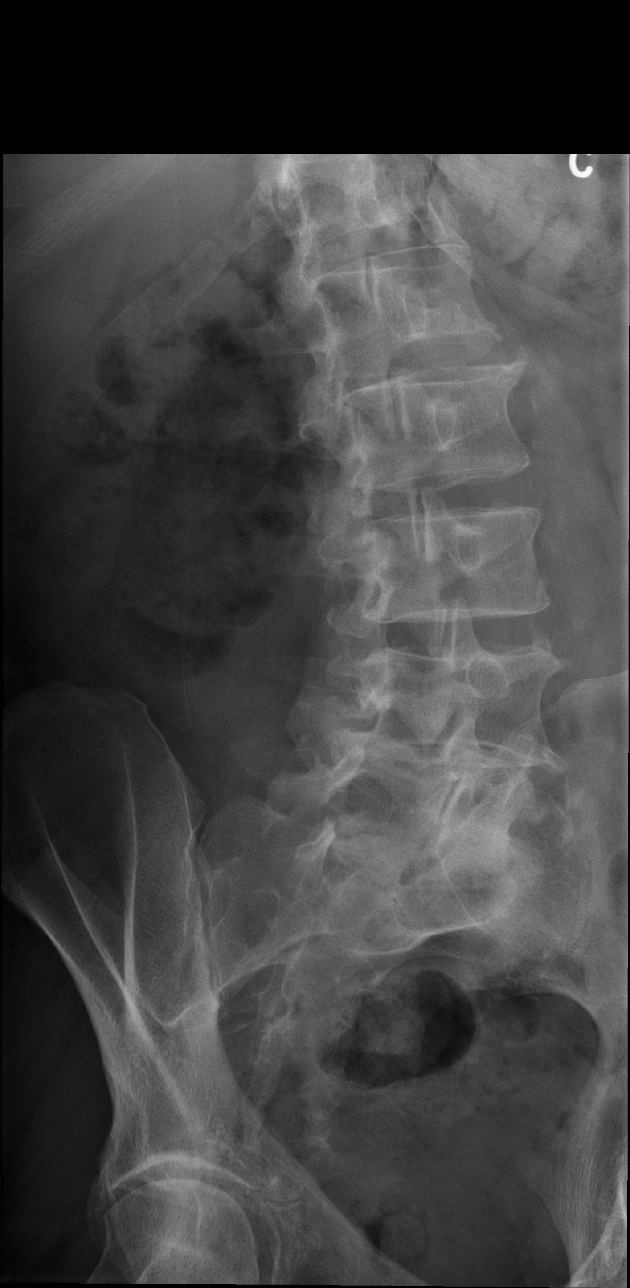

[t lumbar spine lat]
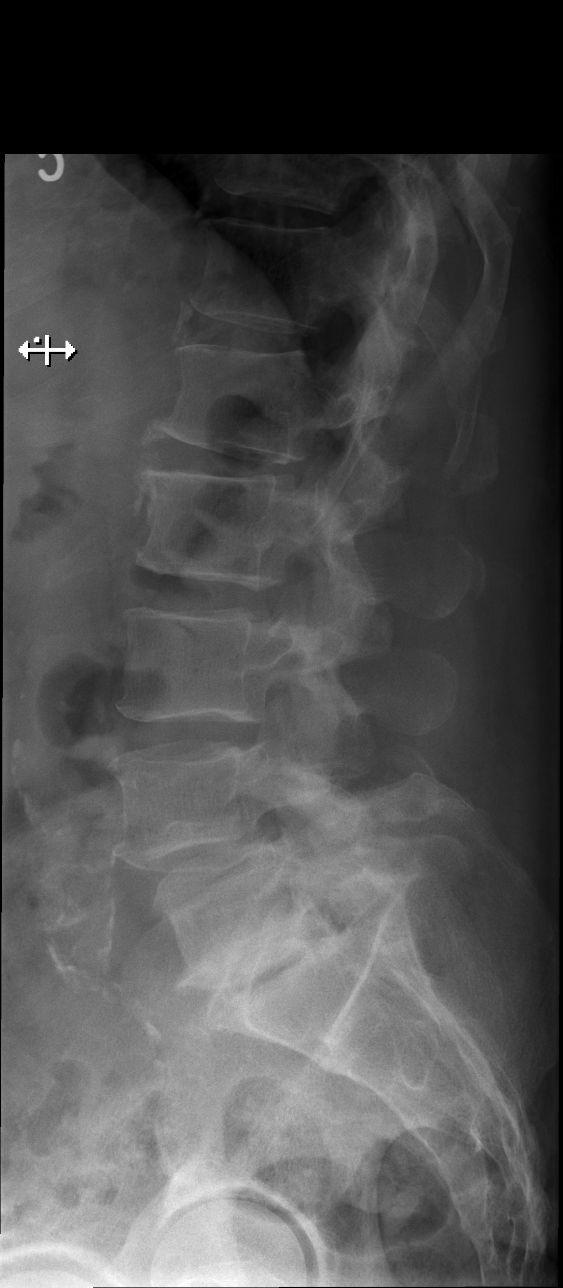

[t lumbar l-5 s-1 spot]
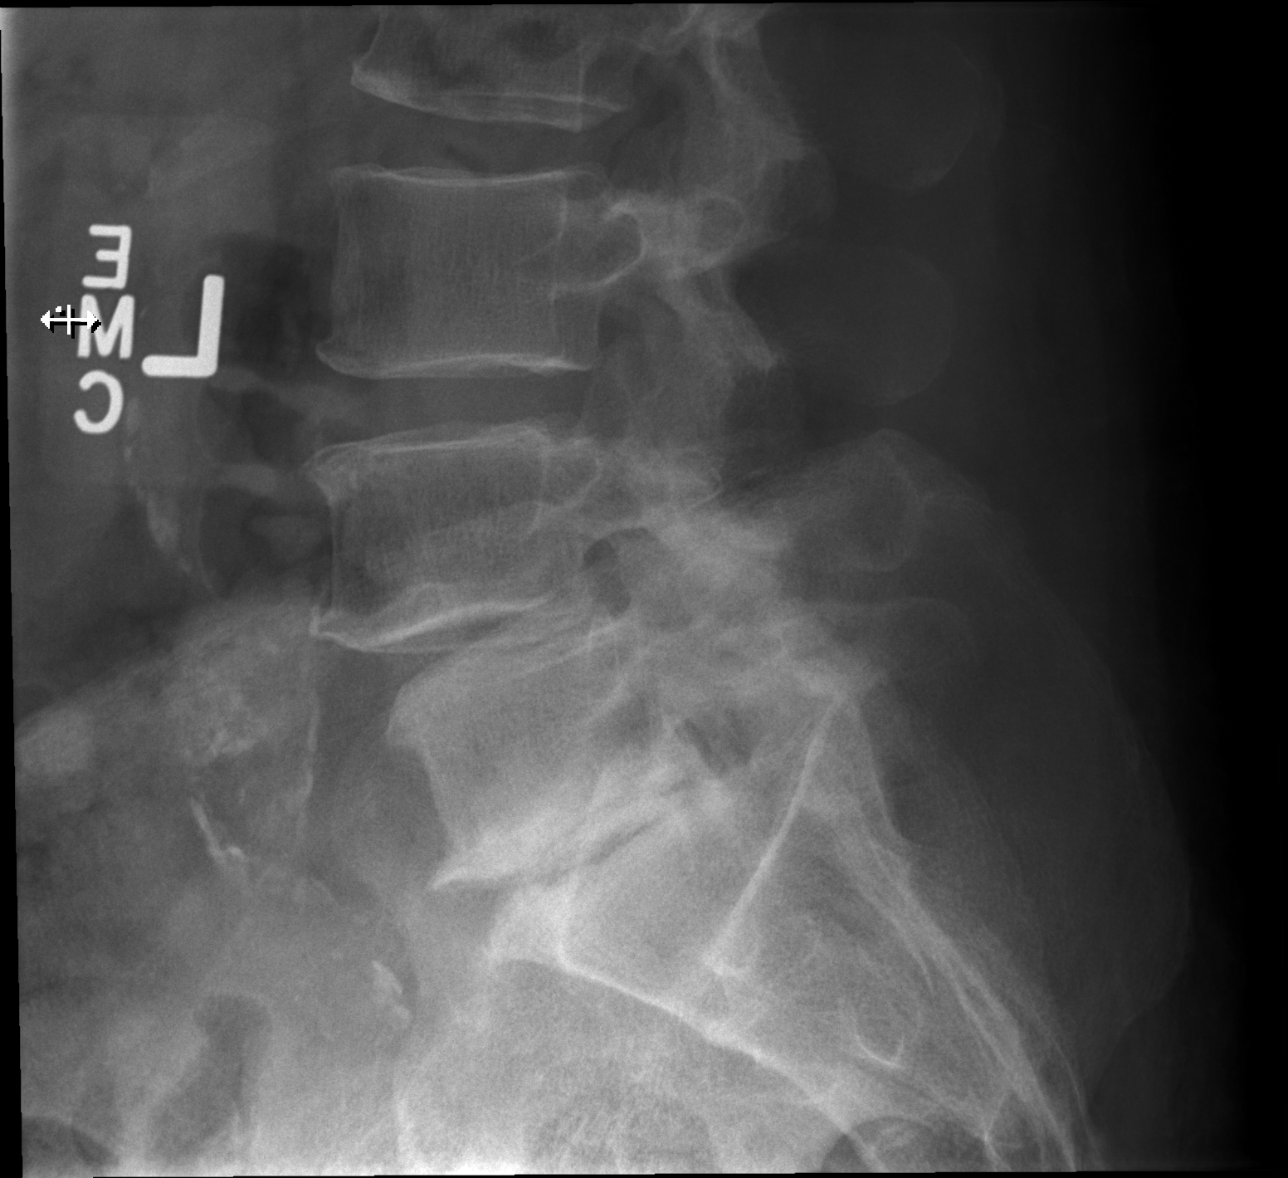

[5 of 5 positions shown; findings below may reference images not displayed]

FINDINGS: Degenerative anterolisthesis of L4 due to facet disease. No definite
pars defects. Stable advanced degenerative disc disease at L5-S1. No
acute bony findings or destructive bony changes. Advanced aortoiliac
artery calcifications but no focal aneurysm. The visualized bony
pelvis is intact.
IMPRESSION: Stable degenerative changes but no acute bony findings.

## 2016-03-18 DEATH — deceased
